# Patient Record
Sex: Female | Born: 1935 | Race: Black or African American | Hispanic: No | Marital: Married | Smoking: Current every day smoker
Health system: Southern US, Community
[De-identification: ages and names within clinical notes are randomized; demographics above are authoritative.]

## PROBLEM LIST (undated history)

## (undated) DIAGNOSIS — Z720 Tobacco use: Secondary | ICD-10-CM

## (undated) DIAGNOSIS — M1990 Unspecified osteoarthritis, unspecified site: Secondary | ICD-10-CM

## (undated) DIAGNOSIS — F209 Schizophrenia, unspecified: Secondary | ICD-10-CM

## (undated) DIAGNOSIS — I10 Essential (primary) hypertension: Secondary | ICD-10-CM

## (undated) HISTORY — DX: Unspecified osteoarthritis, unspecified site: M19.90

---

## 2018-03-24 ENCOUNTER — Inpatient Hospital Stay (HOSPITAL_COMMUNITY)
Admission: EM | Admit: 2018-03-24 | Discharge: 2018-03-27 | DRG: 812 | Disposition: A | Discharge status: Home / Self Care | Payer: Medicare Other | Attending: Internal Medicine | Admitting: Internal Medicine

## 2018-03-24 ENCOUNTER — Other Ambulatory Visit: Payer: Self-pay

## 2018-03-24 ENCOUNTER — Encounter (HOSPITAL_COMMUNITY): Payer: Self-pay | Admitting: Emergency Medicine

## 2018-03-24 DIAGNOSIS — Z79899 Other long term (current) drug therapy: Secondary | ICD-10-CM | POA: Diagnosis not present

## 2018-03-24 DIAGNOSIS — I10 Essential (primary) hypertension: Secondary | ICD-10-CM | POA: Diagnosis not present

## 2018-03-24 DIAGNOSIS — Z716 Tobacco abuse counseling: Secondary | ICD-10-CM | POA: Diagnosis not present

## 2018-03-24 DIAGNOSIS — Z720 Tobacco use: Secondary | ICD-10-CM | POA: Diagnosis present

## 2018-03-24 DIAGNOSIS — F17210 Nicotine dependence, cigarettes, uncomplicated: Secondary | ICD-10-CM | POA: Diagnosis present

## 2018-03-24 DIAGNOSIS — E876 Hypokalemia: Secondary | ICD-10-CM | POA: Diagnosis present

## 2018-03-24 DIAGNOSIS — D509 Iron deficiency anemia, unspecified: Secondary | ICD-10-CM | POA: Diagnosis present

## 2018-03-24 DIAGNOSIS — D649 Anemia, unspecified: Secondary | ICD-10-CM | POA: Diagnosis present

## 2018-03-24 DIAGNOSIS — F1010 Alcohol abuse, uncomplicated: Secondary | ICD-10-CM | POA: Diagnosis present

## 2018-03-24 DIAGNOSIS — F209 Schizophrenia, unspecified: Secondary | ICD-10-CM | POA: Diagnosis not present

## 2018-03-24 DIAGNOSIS — Z7982 Long term (current) use of aspirin: Secondary | ICD-10-CM | POA: Diagnosis not present

## 2018-03-24 HISTORY — DX: Essential (primary) hypertension: I10

## 2018-03-24 HISTORY — DX: Tobacco use: Z72.0

## 2018-03-24 HISTORY — DX: Schizophrenia, unspecified: F20.9

## 2018-03-24 LAB — COMPREHENSIVE METABOLIC PANEL
ALT: 17 U/L (ref 14–54)
AST: 24 U/L (ref 15–41)
Albumin: 3.1 g/dL — ABNORMAL LOW (ref 3.5–5.0)
Alkaline Phosphatase: 114 U/L (ref 38–126)
Anion gap: 13 (ref 5–15)
BUN: 9 mg/dL (ref 6–20)
CHLORIDE: 106 mmol/L (ref 101–111)
CO2: 22 mmol/L (ref 22–32)
Calcium: 8.6 mg/dL — ABNORMAL LOW (ref 8.9–10.3)
Creatinine, Ser: 0.85 mg/dL (ref 0.44–1.00)
GFR calc Af Amer: >60 mL/min (ref >60)
GFR calc non Af Amer: >60 mL/min (ref >60)
Glucose, Bld: 96 mg/dL (ref 65–99)
Potassium: 3 mmol/L — ABNORMAL LOW (ref 3.5–5.1)
SODIUM: 141 mmol/L (ref 135–145)
Total Bilirubin: 0.2 mg/dL — ABNORMAL LOW (ref 0.3–1.2)
Total Protein: 6.7 g/dL (ref 6.5–8.1)

## 2018-03-24 LAB — RETICULOCYTES
RBC.: 2.32 MIL/uL — AB (ref 3.87–5.11)
RETIC COUNT ABSOLUTE: 25.5 10*3/uL (ref 19.0–186.0)
Retic Ct Pct: 1.1 % (ref 0.4–3.1)

## 2018-03-24 LAB — CBC
HCT: 13.8 % — ABNORMAL LOW (ref 36.0–46.0)
HEMATOCRIT: 13.1 % — AB (ref 36.0–46.0)
HEMOGLOBIN: 3.3 g/dL — AB (ref 12.0–15.0)
Hemoglobin: 3.5 g/dL — CL (ref 12.0–15.0)
MCH: 14.5 pg — AB (ref 26.0–34.0)
MCH: 14.2 pg — AB (ref 26.0–34.0)
MCHC: 25.4 g/dL — ABNORMAL LOW (ref 30.0–36.0)
MCHC: 25.2 g/dL — AB (ref 30.0–36.0)
MCV: 57 fL — AB (ref 78.0–100.0)
MCV: 56.5 fL — ABNORMAL LOW (ref 78.0–100.0)
Platelets: 316 10*3/uL (ref 150–400)
Platelets: 307 10*3/uL (ref 150–400)
RBC: 2.42 MIL/uL — AB (ref 3.87–5.11)
RBC: 2.32 MIL/uL — ABNORMAL LOW (ref 3.87–5.11)
RDW: 22.8 % — ABNORMAL HIGH (ref 11.5–15.5)
RDW: 22.9 % — ABNORMAL HIGH (ref 11.5–15.5)
WBC: 6.8 10*3/uL (ref 4.0–10.5)
WBC: 6.3 10*3/uL (ref 4.0–10.5)

## 2018-03-24 LAB — SAVE SMEAR

## 2018-03-24 LAB — DIFFERENTIAL
BASOS PCT: 0 %
Basophils Absolute: 0 10*3/uL (ref 0.0–0.1)
EOS PCT: 0 %
Eosinophils Absolute: 0 10*3/uL (ref 0.0–0.7)
LYMPHS PCT: 29 %
Lymphs Abs: 1.8 10*3/uL (ref 0.7–4.0)
Monocytes Absolute: 0.6 10*3/uL (ref 0.1–1.0)
Monocytes Relative: 10 %
Neutro Abs: 3.9 10*3/uL (ref 1.7–7.7)
Neutrophils Relative %: 61 %

## 2018-03-24 LAB — PROTIME-INR
INR: 1.13
Prothrombin Time: 14.4 seconds (ref 11.4–15.2)

## 2018-03-24 LAB — IRON AND TIBC
Iron: 6 ug/dL — ABNORMAL LOW (ref 28–170)
Saturation Ratios: 1 % — ABNORMAL LOW (ref 10.4–31.8)
TIBC: 455 ug/dL — ABNORMAL HIGH (ref 250–450)
UIBC: 449 ug/dL

## 2018-03-24 LAB — MAGNESIUM: Magnesium: 2.3 mg/dL (ref 1.7–2.4)

## 2018-03-24 LAB — ABO/RH: ABO/RH(D): A POS

## 2018-03-24 LAB — LACTATE DEHYDROGENASE: LDH: 209 U/L — ABNORMAL HIGH (ref 98–192)

## 2018-03-24 LAB — FERRITIN: FERRITIN: 3 ng/mL — AB (ref 11–307)

## 2018-03-24 LAB — FOLATE: Folate: 9.2 ng/mL (ref >5.9)

## 2018-03-24 LAB — VITAMIN B12: VITAMIN B 12: 230 pg/mL (ref 180–914)

## 2018-03-24 LAB — POC OCCULT BLOOD, ED: FECAL OCCULT BLD: NEGATIVE

## 2018-03-24 LAB — PREPARE RBC (CROSSMATCH)

## 2018-03-24 LAB — APTT: aPTT: 28 seconds (ref 24–36)

## 2018-03-24 MED ORDER — SENNOSIDES-DOCUSATE SODIUM 8.6-50 MG PO TABS: 1.0000 | ORAL_TABLET | Freq: Every evening | ORAL | Status: DC | PRN

## 2018-03-24 MED ORDER — AMLODIPINE BESYLATE 10 MG PO TABS
10.0000 mg | ORAL_TABLET | Freq: Every day | ORAL | Status: DC
  Administered 2018-03-25 – 2018-03-27 (×3): 10 mg via ORAL
  Filled 2018-03-24 (×3): qty 1

## 2018-03-24 MED ORDER — POTASSIUM CHLORIDE CRYS ER 20 MEQ PO TBCR
40.0000 meq | EXTENDED_RELEASE_TABLET | Freq: Once | ORAL | Status: AC
  Administered 2018-03-24: 40 meq via ORAL
  Filled 2018-03-24: qty 2

## 2018-03-24 MED ORDER — ACETAMINOPHEN 650 MG RE SUPP: 650.0000 mg | Freq: Four times a day (QID) | RECTAL | Status: DC | PRN

## 2018-03-24 MED ORDER — NICOTINE 21 MG/24HR TD PT24
21.0000 mg | MEDICATED_PATCH | Freq: Every day | TRANSDERMAL | Status: DC
  Administered 2018-03-24 – 2018-03-27 (×4): 21 mg via TRANSDERMAL
  Filled 2018-03-24 (×4): qty 1

## 2018-03-24 MED ORDER — SODIUM CHLORIDE 0.9 % IV BOLUS
500.0000 mL | Freq: Once | INTRAVENOUS | Status: AC
  Administered 2018-03-24: 500 mL via INTRAVENOUS

## 2018-03-24 MED ORDER — HYDRALAZINE HCL 20 MG/ML IJ SOLN: 5.0000 mg | INTRAMUSCULAR | Status: DC | PRN

## 2018-03-24 MED ORDER — ONDANSETRON HCL 4 MG/2ML IJ SOLN: 4.0000 mg | Freq: Four times a day (QID) | INTRAMUSCULAR | Status: DC | PRN

## 2018-03-24 MED ORDER — TRAZODONE HCL 50 MG PO TABS
50.0000 mg | ORAL_TABLET | Freq: Every day | ORAL | Status: DC
  Administered 2018-03-24 – 2018-03-25 (×2): 50 mg via ORAL
  Filled 2018-03-24 (×2): qty 1

## 2018-03-24 MED ORDER — LORAZEPAM 2 MG/ML IJ SOLN
0.5000 mg | Freq: Three times a day (TID) | INTRAMUSCULAR | Status: DC | PRN
  Administered 2018-03-24 – 2018-03-25 (×2): 0.5 mg via INTRAVENOUS
  Filled 2018-03-24 (×2): qty 1

## 2018-03-24 MED ORDER — SODIUM CHLORIDE 0.9 % IV SOLN
Freq: Once | INTRAVENOUS | Status: AC
  Administered 2018-03-24: 20:00:00 via INTRAVENOUS

## 2018-03-24 MED ORDER — ONDANSETRON HCL 4 MG PO TABS: 4.0000 mg | ORAL_TABLET | Freq: Four times a day (QID) | ORAL | Status: DC | PRN

## 2018-03-24 MED ORDER — ZOLPIDEM TARTRATE 5 MG PO TABS: 5.0000 mg | ORAL_TABLET | Freq: Every evening | ORAL | Status: DC | PRN

## 2018-03-24 MED ORDER — ACETAMINOPHEN 325 MG PO TABS
650.0000 mg | ORAL_TABLET | Freq: Four times a day (QID) | ORAL | Status: DC | PRN
Start: 2018-03-24 — End: 2018-03-27
  Filled 2018-03-24: qty 2

## 2018-03-24 MED ORDER — SODIUM CHLORIDE 0.9 % IV BOLUS: 1000.0000 mL | Freq: Once | INTRAVENOUS | Status: DC

## 2018-03-24 MED ORDER — TRIHEXYPHENIDYL HCL 2 MG PO TABS
4.0000 mg | ORAL_TABLET | Freq: Every day | ORAL | Status: DC
  Administered 2018-03-25 – 2018-03-27 (×3): 4 mg via ORAL
  Filled 2018-03-24 (×3): qty 2

## 2018-03-24 NOTE — ED Provider Notes (Signed)
[facility] Provider Note   CSN: [id number] Arrival date & time: 03/24/18  1548     History   Chief Complaint Chief Complaint  Patient presents with  . Abnormal Lab    HPI Janice Castro is a 82 y.o. female.  Chief complaint is abnormal lab.  HPI: 82 year old female.  History of schizophrenia.  Does not see physician on a regular basis other than her psychiatry.  Is on Depo Haldol, and Artane.   Saw primary care physician had some outpatient labs obtained.  Was called today with a hemoglobin of 3.  Patient has schizophrenia and is difficult to get any details of history from.  Her daughter who lives with her and is her primary caregiver states that she has been short of breath with ambulation for "a month".  No dark stools.  No hematemesis.  Past Medical History:  Diagnosis Date  . Hypertension   . Schizophrenia (HCC)   . Tobacco abuse     Patient Active Problem List   Diagnosis Date Noted  . Symptomatic anemia 03/24/2018  . Hypokalemia 03/24/2018  . Schizophrenia (HCC)   . Hypertension   . Tobacco abuse     History reviewed. No pertinent surgical history.   OB History   None      Home Medications    Prior to Admission medications   Medication Sig Start Date End Date Taking? Authorizing Provider  amLODipine (NORVASC) 10 MG tablet Take 10 mg by mouth daily.   Yes [provider]  aspirin EC 325 MG tablet Take 650 mg by mouth 2 (two) times daily as needed (pain).   Yes [provider]  haloperidol decanoate (HALDOL DECANOATE) 100 MG/ML injection Inject 75 mg into the muscle every 28 (twenty-eight) days. Last injection 03/08/18   Yes [provider]  traZODone (DESYREL) 50 MG tablet Take 25-50 mg by mouth at bedtime as needed for sleep.   Yes [provider]  trihexyphenidyl (ARTANE) 2 MG tablet Take 4 mg by mouth daily.   Yes [provider]    Family History No family history on  file.  Social History Social History   Tobacco Use  . Smoking status: Current Every Day Smoker    Packs/day: 1.00    Types: Cigarettes  . Smokeless tobacco: Never Used  Substance Use Topics  . Alcohol use: Not Currently  . Drug use: Not Currently     Allergies   Patient has no known allergies.   Review of Systems Review of Systems  Constitutional: Negative for appetite change, chills, diaphoresis, fatigue and fever.  HENT: Negative for mouth sores, sore throat and trouble swallowing.   Eyes: Negative for visual disturbance.  Respiratory: Positive for shortness of breath. Negative for cough, chest tightness and wheezing.   Cardiovascular: Negative for chest pain.  Gastrointestinal: Negative for abdominal distention, abdominal pain, diarrhea, nausea and vomiting.  Endocrine: Negative for polydipsia, polyphagia and polyuria.  Genitourinary: Negative for dysuria, frequency and hematuria.  Musculoskeletal: Negative for gait problem.  Skin: Negative for color change, pallor and rash.  Neurological: Positive for weakness. Negative for dizziness, syncope, light-headedness and headaches.  Hematological: Does not bruise/bleed easily.  Psychiatric/Behavioral: Negative for behavioral problems and confusion.     Physical Exam Updated Vital Signs BP (!) 139/58   Pulse 75   Temp 98.4 F (36.9 C) (Oral)   Resp 18   Ht 5\' 4"  (1.626 m)   Wt 45.4 kg (100 lb)  SpO2 94%   BMI 17.16 kg/m   Physical Exam  Constitutional: She is oriented to person, place, and time. No distress.  Loud voice.  Increased hypermotor activity.  Not agitated.  HENT:  Head: Normocephalic.  Eyes: Pupils are equal, round, and reactive to light. Conjunctivae are normal. No scleral icterus.  Conjunctive are pale.  Neck: Normal range of motion. Neck supple. No thyromegaly present.  Cardiovascular: Normal rate and regular rhythm. Exam reveals no gallop and no friction rub.  No murmur heard. Pulmonary/Chest:  Effort normal and breath sounds normal. No respiratory distress. She has no wheezes. She has no rales.  Abdominal: Soft. Bowel sounds are normal. She exhibits no distension. There is no tenderness. There is no rebound.  Genitourinary:  Genitourinary Comments: Rectal exam brown stool.  Guaiac negative.  Musculoskeletal: Normal range of motion.  Neurological: She is alert and oriented to person, place, and time.  Skin: Skin is warm and dry. No rash noted.  Psychiatric: She has a normal mood and affect. Her behavior is normal.     ED Treatments / Results  Labs (all labs ordered are listed, but only abnormal results are displayed) Labs Reviewed  COMPREHENSIVE METABOLIC PANEL - Abnormal; Notable for the following components:      Result Value   Potassium 3.0 (*)    Calcium 8.6 (*)    Albumin 3.1 (*)    Total Bilirubin 0.2 (*)    All other components within normal limits  CBC - Abnormal; Notable for the following components:   RBC 2.42 (*)    Hemoglobin 3.5 (*)    HCT 13.8 (*)    MCV 57.0 (*)    MCH 14.5 (*)    MCHC 25.4 (*)    RDW 22.8 (*)    All other components within normal limits  LACTATE DEHYDROGENASE - Abnormal; Notable for the following components:   LDH 209 (*)    All other components within normal limits  MAGNESIUM  VITAMIN B12  FOLATE  IRON AND TIBC  FERRITIN  RETICULOCYTES  HAPTOGLOBIN  PATHOLOGIST SMEAR REVIEW  SAVE SMEAR  DIFFERENTIAL  CBC  CBC  CBC  PROTIME-INR  BASIC METABOLIC PANEL  APTT  POC OCCULT BLOOD, ED  TYPE AND SCREEN  ABO/RH  PREPARE RBC (CROSSMATCH)    EKG None  Radiology No results found.  Procedures Procedures (including critical care time)  Medications Ordered in ED Medications  potassium chloride SA (K-DUR,KLOR-CON) CR tablet 40 mEq (has no administration in time range)  sodium chloride 0.9 % bolus 500 mL (has no administration in time range)  acetaminophen (TYLENOL) tablet 650 mg (has no administration in time range)     Or  acetaminophen (TYLENOL) suppository 650 mg (has no administration in time range)  senna-docusate (Senokot-S) tablet 1 tablet (has no administration in time range)  ondansetron (ZOFRAN) tablet 4 mg (has no administration in time range)    Or  ondansetron (ZOFRAN) injection 4 mg (has no administration in time range)  hydrALAZINE (APRESOLINE) injection 5 mg (has no administration in time range)  zolpidem (AMBIEN) tablet 5 mg (has no administration in time range)  LORazepam (ATIVAN) injection 0.5 mg (has no administration in time range)  nicotine (NICODERM CQ - dosed in mg/24 hours) patch 21 mg (has no administration in time range)  traZODone (DESYREL) tablet 50 mg (has no administration in time range)  0.9 %  sodium chloride infusion ( Intravenous New Bag/Given 03/24/18 1936)     Initial Impression / Assessment and  Plan / ED Course  I have reviewed the triage vital signs and the nursing notes.  Pertinent labs & imaging results that were available during my care of the patient were reviewed by me and considered in my medical decision making (see chart for details).    Blood transfusion requested and initiated in the emergency room.  Discussed with hospitalist Dr. Clyde Lundborg.  Will be admitted for further testing regarding her anemia.  CRITICAL CARE Performed by: Claudean Kinds   Total critical care time: 30 minutes  Critical care time was exclusive of separately billable procedures and treating other patients.  Critical care was necessary to treat or prevent imminent or life-threatening deterioration.  Critical care was time spent personally by me on the following activities: development of treatment plan with patient and/or surrogate as well as nursing, discussions with consultants, evaluation of patient's response to treatment, examination of patient, obtaining history from patient or surrogate, ordering and performing treatments and interventions, ordering and review of laboratory  studies, ordering and review of radiographic studies, pulse oximetry and re-evaluation of patient's condition.   Final Clinical Impressions(s) / ED Diagnoses   Final diagnoses:  Schizophrenia, unspecified type [facility])  Essential hypertension  Tobacco abuse    ED Discharge Orders    None       Rolland Porter, MD 03/24/18 2028

## 2018-03-24 NOTE — ED Notes (Signed)
Attempted report at this time.  Nurse to call back when available. 

## 2018-03-24 NOTE — ED Triage Notes (Signed)
Pt sent by doctors office for reports of hgb of 3.

## 2018-03-24 NOTE — H&P (Addendum)
History and Physical    Janice Castro Obey RUE:[id number] DOB: 1936-05-10 DOA: 03/24/2018  Referring MD/NP/PA:   PCP: No primary care provider on file.   Patient coming from:  The patient is coming from home.  At baseline, pt is independent for most of ADL.   Chief Complaint: SOB and low hgb  HPI: Janice Castro Justin is a 82 y.o. female with medical history significant of Hypertension, tobacco abuse, schizophrenia, who presents with shortness of breath and low hemoglobin.  Patient has history of schizophrenia, cannot provide detailed medical history, therefore, most of the history is obtained by discussing the case with ED physician, per EMS report, and with the nursing staff. Per EDP, patient's sister reported that the patient has been having shortness breath for several weeks. She was seen by PCP and found to have Hgb of 3.0 and sent to ED for further evaluation and treatment. When I saw pt in ED, she has euphoria, and does not have any complains. She keep saying "I am okay". She denies chest pain, shortness breath, cough, nausea, vomiting, diarrhea, abdominal pain, symptoms of UTI. Denies hematuria, hematochezia, hematemesis. No symptoms of UTI or unilateral weakness. She looks pale.  ED Course: pt was found to have Hgb 3.5 (no baseline Hgb available), negative FOBT, WBC 6.8, potassium 3.0, creatinine normal,temperature 99.4, heart rate in 90s, blood pressure 119/44, oxygen saturation 100% on room air, no tachypnea. Patient is admitted to telemetry bed as inpatient.  Review of Systems:   General: no fevers, chills, no body weight gain, no fatigue HEENT: no blurry vision, hearing changes or sore throat Respiratory: has dyspnea, no coughing, wheezing CV: no chest pain, no palpitations GI: no nausea, vomiting, abdominal pain, diarrhea, constipation GU: no dysuria, burning on urination, increased urinary frequency, hematuria  Ext: no leg edema Neuro: no unilateral weakness, numbness, or tingling,  no vision change or hearing loss Skin: no rash, no skin tear. MSK: No muscle spasm, no deformity, no limitation of range of movement in spin Heme: No easy bruising.  Travel history: No recent long distant travel.  Allergy: No Known Allergies  Past Medical History:  Diagnosis Date  . Hypertension   . Schizophrenia (HCC)   . Tobacco abuse     History reviewed. No pertinent surgical history. I tried to review with patient about surgical history, not successful due to schizophrenia.  Social History:  reports that she has been smoking cigarettes.  She has been smoking about 1.00 pack per day. She has never used smokeless tobacco. She reports that she drank alcohol. She reports that she has current or past drug history.  Family History:  I tried to review with patient about family history, but not successful due to schizophrenia.   Prior to Admission medications   Not on File    Physical Exam: Vitals:   03/24/18 1930 03/24/18 1945 03/24/18 2000 03/24/18 2004  BP: (!) 143/66  (!) 139/58   Pulse: 98 90  75  Resp:      Temp:      TempSrc:      SpO2: 100% 95%  94%  Weight:      Height:       General: Not in acute distress. Pale looking, dry mucus and membrane HEENT:       Eyes: PERRL, EOMI, no scleral icterus.       ENT: No discharge from the ears and nose, no pharynx injection, no tonsillar enlargement.        Neck: No JVD,  no bruit, no mass felt. Heme: No neck lymph node enlargement. Cardiac: S1/S2, RRR, No murmurs, No gallops or rubs. Respiratory: No rales, wheezing, rhonchi or rubs. GI: Soft, nondistended, nontender, no rebound pain, no organomegaly, BS present. GU: No hematuria Ext: No pitting leg edema bilaterally. 2+DP/PT pulse bilaterally. Musculoskeletal: No joint deformities, No joint redness or warmth, no limitation of ROM in spin. Skin: No rashes.  Neuro: Alert, oriented X3, cranial nerves II-XII grossly intact, moves all extremities normally.  Psych: Patient is  euphoric, no suicidal or hemocidal ideation.  Labs on Admission: I have personally reviewed following labs and imaging studies  CBC: Recent Labs  Lab 03/24/18 1656  WBC 6.8  HGB 3.5*  HCT 13.8*  MCV 57.0*  PLT 316   Basic Metabolic Panel: Recent Labs  Lab 03/24/18 1656 03/24/18 2000  NA 141  --   K 3.0*  --   CL 106  --   CO2 22  --   GLUCOSE 96  --   BUN 9  --   CREATININE 0.85  --   CALCIUM 8.6*  --   MG  --  2.3   GFR: Estimated Creatinine Clearance: 38.5 mL/min (by C-G formula based on SCr of 0.85 mg/dL). Liver Function Tests: Recent Labs  Lab 03/24/18 1656  AST 24  ALT 17  ALKPHOS 114  BILITOT 0.2*  PROT 6.7  ALBUMIN 3.1*   No results for input(s): LIPASE, AMYLASE in the last 168 hours. No results for input(s): AMMONIA in the last 168 hours. Coagulation Profile: No results for input(s): INR, PROTIME in the last 168 hours. Cardiac Enzymes: No results for input(s): CKTOTAL, CKMB, CKMBINDEX, TROPONINI in the last 168 hours. BNP (last 3 results) No results for input(s): PROBNP in the last 8760 hours. HbA1C: No results for input(s): HGBA1C in the last 72 hours. CBG: No results for input(s): GLUCAP in the last 168 hours. Lipid Profile: No results for input(s): CHOL, HDL, LDLCALC, TRIG, CHOLHDL, LDLDIRECT in the last 72 hours. Thyroid Function Tests: No results for input(s): TSH, T4TOTAL, FREET4, T3FREE, THYROIDAB in the last 72 hours. Anemia Panel: Recent Labs    03/24/18 1931  RETICCTPCT 1.1   Urine analysis: No results found for: COLORURINE, APPEARANCEUR, LABSPEC, PHURINE, GLUCOSEU, HGBUR, BILIRUBINUR, KETONESUR, PROTEINUR, UROBILINOGEN, NITRITE, LEUKOCYTESUR Sepsis Labs: @LABRCNTIP (procalcitonin:4,lacticidven:4) )No results found for this or any previous visit (from the past 240 hour(s)).   Radiological Exams on Admission: No results found.   EKG:  Not done in ED, will get one.   Assessment/Plan Principal Problem:   Symptomatic  anemia Active Problems:   Schizophrenia (HCC)   Hypertension   Hypokalemia   Tobacco abuse   Symptomatic anemia: Hgb 3.5. FOBT negative. No EGD or colonoscopy date on record. Unclear etiology for anemia. Currently hemodynamically stable. MVC 57, likely has iron deficiency, will follow-up anemia panel. Pt will need colonoscopy as outpatient if she did not have one.  -will admit to tele bed as inpt. -transfuse 2 U of blood -2 large IV  -cbc q6h -IVF: 500 NS bolus -check LDH, peripheral smear and haptoglobin, anemia panel, differential  Anemia panel showed iron deficiency -will give one dose of IV Feraheam  -start ferrous sulfate --When necessary Senokot   Schizophrenia (HCC): has euphoria, but no agitation now. -pt has is getting haloperidol injection every 28 days, last dose was on 03/08/18 -Continue Artane -prn ativan for agitation  HTN:  -Continue home medications: amlodipine (we'll hold tonight due to severe anemia and restart tomorrow morning) -  IV hydralazine prn  Hypokalemia: K=3.0 on admission. - Repleted - Check Mg level  Tobacco abuse and Alcohol abuse: -try to do counseling about importance of quitting smoking, but pt dose not want to listen -Nicotine patch   DVT ppx: SCD Code Status: Full code Family Communication: None at bed side.      Disposition Plan:  Anticipate discharge back to previous home environment Consults called:  none Admission status:   Inpatient/tele         Date of Service 03/24/2018    Lorretta Harp [facility] Pager [phone]  If 7PM-7AM, please contact night-coverage [email or web address] Password [facility] 03/24/2018, 8:44 PM

## 2018-03-25 LAB — BASIC METABOLIC PANEL
Anion gap: 9 (ref 5–15)
BUN: 8 mg/dL (ref 6–20)
CHLORIDE: 109 mmol/L (ref 101–111)
CO2: 22 mmol/L (ref 22–32)
CREATININE: 0.73 mg/dL (ref 0.44–1.00)
Calcium: 8.4 mg/dL — ABNORMAL LOW (ref 8.9–10.3)
GFR calc non Af Amer: >60 mL/min (ref >60)
Glucose, Bld: 86 mg/dL (ref 65–99)
POTASSIUM: 3.6 mmol/L (ref 3.5–5.1)
Sodium: 140 mmol/L (ref 135–145)

## 2018-03-25 LAB — CBC
HEMATOCRIT: 35.6 % — AB (ref 36.0–46.0)
HEMATOCRIT: 35.4 % — AB (ref 36.0–46.0)
HEMOGLOBIN: 11.2 g/dL — AB (ref 12.0–15.0)
HEMOGLOBIN: 11.9 g/dL — AB (ref 12.0–15.0)
MCH: 22.4 pg — AB (ref 26.0–34.0)
MCH: 23.6 pg — ABNORMAL LOW (ref 26.0–34.0)
MCHC: 31.5 g/dL (ref 30.0–36.0)
MCHC: 33.6 g/dL (ref 30.0–36.0)
MCV: 71.3 fL — ABNORMAL LOW (ref 78.0–100.0)
MCV: 70.1 fL — AB (ref 78.0–100.0)
Platelets: 237 10*3/uL (ref 150–400)
Platelets: ADEQUATE 10*3/uL (ref 150–400)
RBC: 4.99 MIL/uL (ref 3.87–5.11)
RBC: 5.05 MIL/uL (ref 3.87–5.11)
RDW: 25.5 % — ABNORMAL HIGH (ref 11.5–15.5)
RDW: 26.2 % — AB (ref 11.5–15.5)
WBC: 6.2 10*3/uL (ref 4.0–10.5)
WBC: 6.5 10*3/uL (ref 4.0–10.5)

## 2018-03-25 LAB — GLUCOSE, CAPILLARY: GLUCOSE-CAPILLARY: 84 mg/dL (ref 65–99)

## 2018-03-25 MED ORDER — SODIUM CHLORIDE 0.9 % IV SOLN
510.0000 mg | Freq: Once | INTRAVENOUS | Status: AC
  Administered 2018-03-25: 510 mg via INTRAVENOUS
  Filled 2018-03-25: qty 17

## 2018-03-25 MED ORDER — SENNOSIDES-DOCUSATE SODIUM 8.6-50 MG PO TABS
1.0000 | ORAL_TABLET | Freq: Two times a day (BID) | ORAL | Status: DC | PRN
  Administered 2018-03-26: 1 via ORAL
  Filled 2018-03-25 (×2): qty 1

## 2018-03-25 MED ORDER — FERROUS SULFATE 325 (65 FE) MG PO TABS
325.0000 mg | ORAL_TABLET | Freq: Two times a day (BID) | ORAL | Status: DC
  Administered 2018-03-25 – 2018-03-27 (×5): 325 mg via ORAL
  Filled 2018-03-25 (×5): qty 1

## 2018-03-25 NOTE — Progress Notes (Signed)
PROGRESS NOTE    Janice Castro  ZOX:[id number] DOB: 07-07-1936 DOA: 03/24/2018 PCP: No primary care provider on file.   Brief Narrative:81 y.o. female with medical history significant of Hypertension, tobacco abuse, schizophrenia, who presents with shortness of breath and low hemoglobin.  Patient has history of schizophrenia, cannot provide detailed medical history, therefore, most of the history is obtained by discussing the case with ED physician, per EMS report, and with the nursing staff. Per EDP, patient's sister reported that the patient has been having shortness breath for several weeks. She was seen by PCP and found to have Hgb of 3.0 and sent to ED for further evaluation and treatment. When I saw pt in ED, she has euphoria, and does not have any complains. She keep saying "I am okay". She denies chest pain, shortness breath, cough, nausea, vomiting, diarrhea, abdominal pain, symptoms of UTI. Denies hematuria, hematochezia, hematemesis. No symptoms of UTI or unilateral weakness. She looks pale.  ED Course: pt was found to have Hgb 3.5 (no baseline Hgb available), negative FOBT, WBC 6.8, potassium 3.0, creatinine normal,temperature 99.4, heart rate in 90s, blood pressure 119/44, oxygen saturation 100% on room air, no tachypnea. Patient is admitted to telemetry bed as inpatient.    Assessment & Plan:   Principal Problem:   Symptomatic anemia Active Problems:   Schizophrenia (HCC)   Hypertension   Hypokalemia   Tobacco abuse  1] severe symptomatic iron deficiency anemia-hemoglobin of 3.5.  FOBT negative.  Patient's daughter was at the bedside today.  She reported that she has never been told she is anemic.  Has never had a GI workup.  Patient lives alone at home.  Daughter checks up on her every day.  She has received 1 unit of blood transfusion so for the second 1 is ongoing at this time IV Feraheme ordered.  Repeat hemoglobin is pending.  Patient is awake alert asking for cool  aid.  2] hypertension restart Norvasc.  3]Schizophrenia stable    DVT prophylaxis SCD Code Status: Full code Family Communication: Discussed with daughter who was in the room Disposition Plan: TBD if she remains stable plan discharge tomorrow and she can follow-up with GI as an outpatient.  Consultants: None  Procedures: None Antimicrobials: None  Subjective: Resting in bed in no acute distress asking for something to eat or drink.  Objective: Vitals:   03/25/18 0522 03/25/18 0607 03/25/18 0622 03/25/18 0924  BP: (!) 143/68 138/70 136/67 (!) 147/67  Pulse: 76 79 79 74  Resp: 16 18 18 17   Temp: 98.7 F (37.1 C) 98.7 F (37.1 C) 99.2 F (37.3 C) 99.4 F (37.4 C)  TempSrc: Oral Oral Oral Oral  SpO2: 98%   97%  Weight:      Height:        Intake/Output Summary (Last 24 hours) at 03/25/2018 1144 Last data filed at 03/25/2018 0924 Gross per 24 hour  Intake 1708 ml  Output -  Net 1708 ml   Filed Weights   03/24/18 1601 03/24/18 2106  Weight: 45.4 kg (100 lb) 47.4 kg (104 lb 8 oz)    Examination:  General exam: Appears calm and comfortable  Respiratory system: Clear to auscultation. Respiratory effort normal. Cardiovascular system: S1 & S2 heard, RRR. No JVD, murmurs, rubs, gallops or clicks. No pedal edema. Gastrointestinal system: Abdomen is nondistended, soft and nontender. No organomegaly or masses felt. Normal bowel sounds heard. Central nervous system: Alert and oriented. No focal neurological deficits. Extremities: Symmetric 5 x 5  power. Skin: No rashes, lesions or ulcers Psychiatry: Judgement and insight appear normal. Mood & affect appropriate.     Data Reviewed: I have personally reviewed following labs and imaging studies  CBC: Recent Labs  Lab 03/24/18 1656 03/24/18 1913  WBC 6.8 6.3  NEUTROABS  --  3.9  HGB 3.5* 3.3*  HCT 13.8* 13.1*  MCV 57.0* 56.5*  PLT 316 307   Basic Metabolic Panel: Recent Labs  Lab 03/24/18 1656 03/24/18 2000    NA 141  --   K 3.0*  --   CL 106  --   CO2 22  --   GLUCOSE 96  --   BUN 9  --   CREATININE 0.85  --   CALCIUM 8.6*  --   MG  --  2.3   GFR: Estimated Creatinine Clearance: 40.2 mL/min (by C-G formula based on SCr of 0.85 mg/dL). Liver Function Tests: Recent Labs  Lab 03/24/18 1656  AST 24  ALT 17  ALKPHOS 114  BILITOT 0.2*  PROT 6.7  ALBUMIN 3.1*   No results for input(s): LIPASE, AMYLASE in the last 168 hours. No results for input(s): AMMONIA in the last 168 hours. Coagulation Profile: Recent Labs  Lab 03/24/18 1929  INR 1.13   Cardiac Enzymes: No results for input(s): CKTOTAL, CKMB, CKMBINDEX, TROPONINI in the last 168 hours. BNP (last 3 results) No results for input(s): PROBNP in the last 8760 hours. HbA1C: No results for input(s): HGBA1C in the last 72 hours. CBG: Recent Labs  Lab 03/25/18 0759  GLUCAP 84   Lipid Profile: No results for input(s): CHOL, HDL, LDLCALC, TRIG, CHOLHDL, LDLDIRECT in the last 72 hours. Thyroid Function Tests: No results for input(s): TSH, T4TOTAL, FREET4, T3FREE, THYROIDAB in the last 72 hours. Anemia Panel: Recent Labs    03/24/18 1931  VITAMINB12 230  FOLATE 9.2  FERRITIN 3*  TIBC 455*  IRON 6*  RETICCTPCT 1.1   Sepsis Labs: No results for input(s): PROCALCITON, LATICACIDVEN in the last 168 hours.  No results found for this or any previous visit (from the past 240 hour(s)).       Radiology Studies: No results found.      Scheduled Meds: . amLODipine  10 mg Oral Daily  . ferrous sulfate  325 mg Oral BID WC  . nicotine  21 mg Transdermal Daily  . traZODone  50 mg Oral QHS  . trihexyphenidyl  4 mg Oral Daily   Continuous Infusions:   LOS: 1 day      Janice RenElizabeth G Lamya Lausch, MD [facility]  If 7PM-7AM, please contact night-coverage [email or web address] Password [email or web address] 03/25/2018, 11:44 AM

## 2018-03-26 LAB — URINALYSIS, ROUTINE W REFLEX MICROSCOPIC
Bilirubin Urine: NEGATIVE
Glucose, UA: NEGATIVE mg/dL
HGB URINE DIPSTICK: NEGATIVE
Ketones, ur: NEGATIVE mg/dL
Leukocytes, UA: NEGATIVE
Nitrite: NEGATIVE
PH: 7 (ref 5.0–8.0)
Protein, ur: NEGATIVE mg/dL
SPECIFIC GRAVITY, URINE: 1.011 (ref 1.005–1.030)

## 2018-03-26 LAB — CBC WITH DIFFERENTIAL/PLATELET
BASOS PCT: 0 %
Basophils Absolute: 0 10*3/uL (ref 0.0–0.1)
EOS ABS: 0.1 10*3/uL (ref 0.0–0.7)
Eosinophils Relative: 2 %
HCT: 35.4 % — ABNORMAL LOW (ref 36.0–46.0)
HEMOGLOBIN: 11.1 g/dL — AB (ref 12.0–15.0)
LYMPHS ABS: 1.5 10*3/uL (ref 0.7–4.0)
LYMPHS PCT: 24 %
MCH: 22.3 pg — AB (ref 26.0–34.0)
MCHC: 31.4 g/dL (ref 30.0–36.0)
MCV: 71.2 fL — ABNORMAL LOW (ref 78.0–100.0)
MONO ABS: 0.6 10*3/uL (ref 0.1–1.0)
Monocytes Relative: 9 %
NEUTROS ABS: 4 10*3/uL (ref 1.7–7.7)
Neutrophils Relative %: 65 %
Platelets: 236 10*3/uL (ref 150–400)
RBC: 4.97 MIL/uL (ref 3.87–5.11)
RDW: 25.8 % — ABNORMAL HIGH (ref 11.5–15.5)
WBC: 6.2 10*3/uL (ref 4.0–10.5)

## 2018-03-26 LAB — BASIC METABOLIC PANEL
Anion gap: 8 (ref 5–15)
BUN: <5 mg/dL — ABNORMAL LOW (ref 6–20)
CALCIUM: 8.2 mg/dL — AB (ref 8.9–10.3)
CHLORIDE: 109 mmol/L (ref 101–111)
CO2: 21 mmol/L — ABNORMAL LOW (ref 22–32)
Creatinine, Ser: 0.54 mg/dL (ref 0.44–1.00)
GFR calc Af Amer: >60 mL/min (ref >60)
GFR calc non Af Amer: >60 mL/min (ref >60)
Glucose, Bld: 81 mg/dL (ref 65–99)
Potassium: 2.9 mmol/L — ABNORMAL LOW (ref 3.5–5.1)
SODIUM: 138 mmol/L (ref 135–145)

## 2018-03-26 LAB — GLUCOSE, CAPILLARY: GLUCOSE-CAPILLARY: 99 mg/dL (ref 65–99)

## 2018-03-26 LAB — HAPTOGLOBIN: Haptoglobin: 140 mg/dL (ref 34–200)

## 2018-03-26 MED ORDER — STROKE: EARLY STAGES OF RECOVERY BOOK
Freq: Once | Status: DC
  Filled 2018-03-26: qty 1

## 2018-03-26 MED ORDER — POTASSIUM CHLORIDE CRYS ER 20 MEQ PO TBCR
40.0000 meq | EXTENDED_RELEASE_TABLET | ORAL | Status: AC
  Administered 2018-03-26 (×2): 40 meq via ORAL
  Filled 2018-03-26 (×2): qty 2

## 2018-03-26 MED ORDER — POTASSIUM CHLORIDE 10 MEQ/100ML IV SOLN
10.0000 meq | INTRAVENOUS | Status: AC
  Administered 2018-03-26 (×4): 10 meq via INTRAVENOUS
  Filled 2018-03-26 (×4): qty 100

## 2018-03-26 NOTE — Progress Notes (Signed)
PROGRESS NOTE    Janice Castro  ZOX:[id number] DOB: [birth year] DOA: 03/24/2018 PCP: No primary care provider on file.   Brief Narrative:81 y.o.femalewith medical history significant ofHypertension, tobacco abuse, schizophrenia, who presents with shortness of breath and low hemoglobin.  Patient hashistory of schizophrenia, cannot provide detailed medical history,therefore, most of the history is obtained by discussing the case with ED physician, per EMS report, and with the nursing staff. Per EDP, patient's sister reported that the patient has been having shortness breath for several weeks. She was seen by PCP and found to have Hgb of 3.0 and sent to ED for further evaluation and treatment. When I saw pt in ED, she has euphoria, and does not have any complains. She keep saying "I am okay". She denies chest pain, shortness breath, cough, nausea, vomiting, diarrhea, abdominal pain, symptoms of UTI. Denies hematuria, hematochezia, hematemesis. No symptoms of UTI or unilateral weakness. She looks pale.  ED Course:pt was found to haveHgb 3.5 (no baseline Hgb available), negative FOBT, WBC 6.8, potassium 3.0, creatinine normal,temperature 99.4, heart rate in 90s, blood pressure 119/44, oxygen saturation 100% on room air, no tachypnea. Patient is admitted to telemetry bed as inpatient.     Assessment & Plan:   Principal Problem:   Symptomatic anemia Active Problems:   Schizophrenia (HCC)   Hypertension   Hypokalemia   Tobacco abuse 1] severe symptomatic iron deficiency anemia-hemoglobin of 3.5 on admit. FOBT negative.  Patient's daughter was at the bedside today.  She reported that she has never been told she is anemic.  Has never had a GI workup.  Patient lives alone at home.  Daughter checks up on her every day.  Status post 2 units of blood transfusion and Feraheme.  Her last hemoglobin after blood transfusion was 11.1.    2] hypertension restart Norvasc.  3]Schizophrenia  stable  4] hypokalemia potassium 2.9 replete and recheck.     DVT prophylaxis: SCD Code Status: Full code Family Communication: Discussed with daughter Disposition Plan: We will obtain PT evaluation preparation for discharge tomorrow.   Consultants: None Procedures: None Antimicrobials: None  Subjective: Patient's daughter by the bedside reports that patient.  Bed twice which she has never done before.  Patient's daughter also requesting a Child psychotherapistsocial worker to see her for her to establish POA.  Objective: Vitals:   03/25/18 1327 03/25/18 2029 03/26/18 0956 03/26/18 1258  BP: 122/82 (!) 154/59 (!) 150/66 (!) 129/56  Pulse: (!) 18 72  72  Resp: 18 17  16   Temp: 98.9 F (37.2 C) 98.5 F (36.9 C)  98.8 F (37.1 C)  TempSrc: Oral Oral  Oral  SpO2: 98% 96%  98%  Weight:      Height:       No intake or output data in the 24 hours ending 03/26/18 1304 Filed Weights   03/24/18 1601 03/24/18 2106  Weight: 45.4 kg (100 lb) 47.4 kg (104 lb 8 oz)    Examination:  General exam: Appears calm and comfortable  Respiratory system: Clear to auscultation. Respiratory effort normal. Cardiovascular system: S1 & S2 heard, RRR. No JVD, murmurs, rubs, gallops or clicks. No pedal edema. Gastrointestinal system: Abdomen is nondistended, soft and nontender. No organomegaly or masses felt. Normal bowel sounds heard. Central nervous system: Alert and oriented. No focal neurological deficits. Extremities: Symmetric 5 x 5 power. Skin: No rashes, lesions or ulcers Psychiatry: Judgement and insight appear normal. Mood & affect appropriate.     Data Reviewed: I have personally reviewed  following labs and imaging studies  CBC: Recent Labs  Lab 03/24/18 1656 03/24/18 1913 03/25/18 1108 03/25/18 1832 03/26/18 0839  WBC 6.8 6.3 6.2 6.5 6.2  NEUTROABS  --  3.9  --   --  4.0  HGB 3.5* 3.3* 11.2* 11.9* 11.1*  HCT 13.8* 13.1* 35.6* 35.4* 35.4*  MCV 57.0* 56.5* 71.3* 70.1* 71.2*  PLT 316 307 237  PLATELET CLUMPS NOTED ON SMEAR, COUNT APPEARS ADEQUATE 236   Basic Metabolic Panel: Recent Labs  Lab 03/24/18 1656 03/24/18 2000 03/25/18 1108 03/26/18 0839  NA 141  --  140 138  K 3.0*  --  3.6 2.9*  CL 106  --  109 109  CO2 22  --  22 21*  GLUCOSE 96  --  86 81  BUN 9  --  8 <5*  CREATININE 0.85  --  0.73 0.54  CALCIUM 8.6*  --  8.4* 8.2*  MG  --  2.3  --   --    GFR: Estimated Creatinine Clearance: 42.7 mL/min (by C-G formula based on SCr of 0.54 mg/dL). Liver Function Tests: Recent Labs  Lab 03/24/18 1656  AST 24  ALT 17  ALKPHOS 114  BILITOT 0.2*  PROT 6.7  ALBUMIN 3.1*   No results for input(s): LIPASE, AMYLASE in the last 168 hours. No results for input(s): AMMONIA in the last 168 hours. Coagulation Profile: Recent Labs  Lab 03/24/18 1929  INR 1.13   Cardiac Enzymes: No results for input(s): CKTOTAL, CKMB, CKMBINDEX, TROPONINI in the last 168 hours. BNP (last 3 results) No results for input(s): PROBNP in the last 8760 hours. HbA1C: No results for input(s): HGBA1C in the last 72 hours. CBG: Recent Labs  Lab 03/25/18 0759 03/26/18 1256  GLUCAP 84 99   Lipid Profile: No results for input(s): CHOL, HDL, LDLCALC, TRIG, CHOLHDL, LDLDIRECT in the last 72 hours. Thyroid Function Tests: No results for input(s): TSH, T4TOTAL, FREET4, T3FREE, THYROIDAB in the last 72 hours. Anemia Panel: Recent Labs    03/24/18 1931  VITAMINB12 230  FOLATE 9.2  FERRITIN 3*  TIBC 455*  IRON 6*  RETICCTPCT 1.1   Sepsis Labs: No results for input(s): PROCALCITON, LATICACIDVEN in the last 168 hours.  No results found for this or any previous visit (from the past 240 hour(s)).       Radiology Studies: No results found.      Scheduled Meds: . amLODipine  10 mg Oral Daily  . ferrous sulfate  325 mg Oral BID WC  . nicotine  21 mg Transdermal Daily  . traZODone  50 mg Oral QHS  . trihexyphenidyl  4 mg Oral Daily   Continuous Infusions:   LOS: 2 days       Alwyn Ren, MD [facility] If 7PM-7AM, please contact night-coverage [email or web address] Password [email or web address] 03/26/2018, 1:04 PM

## 2018-03-26 NOTE — Evaluation (Signed)
Physical Therapy Evaluation Patient Details Name: Janice Castro MRN: [id number] DOB: September 17, 1936 Today's Date: 03/26/2018   History of Present Illness  Pt adm with symptomatic anemia with Hgb of 3.0. Transfused 2 units of blood. PMH - schizophrenia, HTN  Clinical Impression  Pt presents to PT close to baseline with mobility. Feel pt can return home with daughter provided intermittent support.    Follow Up Recommendations No PT follow up    Equipment Recommendations  None recommended by PT    Recommendations for Other Services       Precautions / Restrictions        Mobility  Bed Mobility Overal bed mobility: Modified Independent                Transfers Overall transfer level: Modified independent                  Ambulation/Gait Ambulation/Gait assistance: Supervision Ambulation Distance (Feet): 170 Feet Assistive device: None Gait Pattern/deviations: Step-through pattern;Drifts right/left   Gait velocity interpretation: 1.31 - 2.62 ft/sec, indicative of limited community ambulator General Gait Details: Slightly unsteady gait but pt able to correct without assist. Instability usually when distracted by conversation or other activities around her in hallway.  Stairs            Wheelchair Mobility    Modified Rankin (Stroke Patients Only)       Balance Overall balance assessment: Needs assistance Sitting-balance support: No upper extremity supported;Feet supported Sitting balance-Leahy Scale: Normal     Standing balance support: No upper extremity supported;During functional activity Standing balance-Leahy Scale: Good                               Pertinent Vitals/Pain Pain Assessment: Faces Faces Pain Scale: No hurt    Home Living Family/patient expects to be discharged to:: Private residence Living Arrangements: Alone Available Help at Discharge: Family;Available PRN/intermittently(daughter involved and checks on )            Home Equipment: None      Prior Function Level of Independence: Independent               Hand Dominance        Extremity/Trunk Assessment   Upper Extremity Assessment Upper Extremity Assessment: Overall WFL for tasks assessed    Lower Extremity Assessment Lower Extremity Assessment: Overall WFL for tasks assessed       Communication   Communication: No difficulties  Cognition Arousal/Alertness: Awake/alert Behavior During Therapy: WFL for tasks assessed/performed Overall Cognitive Status: History of cognitive impairments - at baseline                                        General Comments      Exercises     Assessment/Plan    PT Assessment Patent does not need any further PT services  PT Problem List         PT Treatment Interventions      PT Goals (Current goals can be found in the Care Plan section)  Acute Rehab PT Goals PT Goal Formulation: All assessment and education complete, DC therapy    Frequency     Barriers to discharge        Co-evaluation               AM-PAC PT "6 Clicks"  Daily Activity  Outcome Measure Difficulty turning over in bed (including adjusting bedclothes, sheets and blankets)?: None Difficulty moving from lying on back to sitting on the side of the bed? : None Difficulty sitting down on and standing up from a chair with arms (e.g., wheelchair, bedside commode, etc,.)?: None Help needed moving to and from a bed to chair (including a wheelchair)?: None Help needed walking in hospital room?: A Little Help needed climbing 3-5 steps with a railing? : A Little 6 Click Score: 22    End of Session   Activity Tolerance: Patient tolerated treatment well Patient left: in bed;with call bell/phone within reach;with family/visitor present   PT Visit Diagnosis: Unsteadiness on feet (R26.81)    Time: [id number] PT Time Calculation (min) (ACUTE ONLY): 8 min   Charges:   PT Evaluation $PT  Eval Low Complexity: 1 Low     PT G CodesSkip Mayer:        Sherisse Fullilove PT [phone]   Angelina OkCary W [facility]ok 03/26/2018, 4:38 PM

## 2018-03-27 DIAGNOSIS — D649 Anemia, unspecified: Secondary | ICD-10-CM

## 2018-03-27 LAB — BPAM RBC
BLOOD PRODUCT EXPIRATION DATE: 201905182359
BLOOD PRODUCT EXPIRATION DATE: 201905182359
BLOOD PRODUCT EXPIRATION DATE: 201905192359
Blood Product Expiration Date: 201905182359
Blood Product Expiration Date: 201905182359
Blood Product Expiration Date: 201905192359
ISSUE DATE / TIME: 201904191859
ISSUE DATE / TIME: 201904200224
ISSUE DATE / TIME: 201904192243
ISSUE DATE / TIME: 201904200558
UNIT TYPE AND RH: 6200
UNIT TYPE AND RH: 6200
UNIT TYPE AND RH: 6200
Unit Type and Rh: 6200
Unit Type and Rh: 6200
Unit Type and Rh: 6200

## 2018-03-27 LAB — BASIC METABOLIC PANEL
ANION GAP: 7 (ref 5–15)
BUN: <5 mg/dL — ABNORMAL LOW (ref 6–20)
CHLORIDE: 108 mmol/L (ref 101–111)
CO2: 20 mmol/L — AB (ref 22–32)
Calcium: 8.8 mg/dL — ABNORMAL LOW (ref 8.9–10.3)
Creatinine, Ser: 0.57 mg/dL (ref 0.44–1.00)
GFR calc Af Amer: >60 mL/min (ref >60)
GLUCOSE: 83 mg/dL (ref 65–99)
Potassium: 4.2 mmol/L (ref 3.5–5.1)
Sodium: 135 mmol/L (ref 135–145)

## 2018-03-27 LAB — TYPE AND SCREEN
ABO/RH(D): A POS
Antibody Screen: NEGATIVE
UNIT DIVISION: 0
UNIT DIVISION: 0
UNIT DIVISION: 0
Unit division: 0
Unit division: 0
Unit division: 0

## 2018-03-27 LAB — PATHOLOGIST SMEAR REVIEW

## 2018-03-27 LAB — GLUCOSE, CAPILLARY: Glucose-Capillary: 89 mg/dL (ref 65–99)

## 2018-03-27 MED ORDER — FERROUS SULFATE 325 (65 FE) MG PO TABS: 325.0000 mg | ORAL_TABLET | Freq: Two times a day (BID) | ORAL | 3 refills | Status: DC

## 2018-03-27 MED ORDER — NICOTINE 21 MG/24HR TD PT24: 21.0000 mg | MEDICATED_PATCH | Freq: Every day | TRANSDERMAL | 0 refills | Status: DC

## 2018-03-27 NOTE — Discharge Summary (Signed)
Physician Discharge Summary  Janice Castro ZOX:[id number] DOB: May 27, 1936 DOA: 03/24/2018  PCP: No primary care provider on file.  Admit date: 03/24/2018 Discharge date: 03/27/2018  Admitted From: Home Disposition:  Home Recommendations for Outpatient Follow-up:  1. Follow up with PCP in 1-2 weeks 2. Please obtain BMP/CBC in one week  Home Health: None Equipment/Devices none Discharge Condition: Stable CODE STATUS full code Diet recommendation:regular diet  Brief/Interim Summary::81 y.o.femalewith medical history significant ofHypertension, tobacco abuse, schizophrenia, who presents with shortness of breath and low hemoglobin.  Patient hashistory of schizophrenia, cannot provide detailed medical history,therefore, most of the history is obtained by discussing the case with ED physician, per EMS report, and with the nursing staff. Per EDP, patient's sister reported that the patient has been having shortness breath for several weeks. She was seen by PCP and found to have Hgb of 3.0 and sent to ED for further evaluation and treatment. When I saw pt in ED, she has euphoria, and does not have any complains. She keep saying "I am okay". She denies chest pain, shortness breath, cough, nausea, vomiting, diarrhea, abdominal pain, symptoms of UTI. Denies hematuria, hematochezia, hematemesis. No symptoms of UTI or unilateral weakness. She looks pale.  ED Course:pt was found to haveHgb 3.5 (no baseline Hgb available), negative FOBT, WBC 6.8, potassium 3.0, creatinine normal,temperature 99.4, heart rate in 90s, blood pressure 119/44, oxygen saturation 100% on room air, no tachypnea. Patient is admitted to telemetry bed as inpatient.     Discharge Diagnoses:  Principal Problem:   Symptomatic anemia Active Problems:   Schizophrenia (HCC)   Hypertension   Hypokalemia   Tobacco abuse  1] severe symptomatic iron deficiency anemia of unclear etiology.  She was given 2 units of blood  transfusion which brought her hemoglobin from 3.5-11.  FOBT was negative.  It was noted that she takes aspirin 650 mg twice a day at home.  This has been discontinued at the time of discharge.  Discussed with patient's daughter that patient must follow-up with GI even though her FOBT was negative.  She was also given Feraheme.  2] schizophrenia-continue home medications artane, Haldol, and trazodone.  3] hypertension continue Norvasc. Discharge Instructions  Discharge Instructions    Call MD for:  extreme fatigue   Complete by:  As directed    Call MD for:  persistant dizziness or light-headedness   Complete by:  As directed    Call MD for:  persistant nausea and vomiting   Complete by:  As directed    Call MD for:  redness, tenderness, or signs of infection (pain, swelling, redness, odor or green/yellow discharge around incision site)   Complete by:  As directed    Call MD for:  severe uncontrolled pain   Complete by:  As directed    Diet - low sodium heart healthy   Complete by:  As directed    Increase activity slowly   Complete by:  As directed      Allergies as of 03/27/2018   No Known Allergies     Medication List    STOP taking these medications   aspirin EC 325 MG tablet     TAKE these medications   amLODipine 10 MG tablet Commonly known as:  NORVASC Take 10 mg by mouth daily.   ferrous sulfate 325 (65 FE) MG tablet Take 1 tablet (325 mg total) by mouth 2 (two) times daily with a meal.   haloperidol decanoate 100 MG/ML injection Commonly known as:  HALDOL  DECANOATE Inject 75 mg into the muscle every 28 (twenty-eight) days. Last injection 03/08/18   nicotine 21 mg/24hr patch Commonly known as:  NICODERM CQ - dosed in mg/24 hours Place 1 patch (21 mg total) onto the skin daily. Start taking on:  03/28/2018   traZODone 50 MG tablet Commonly known as:  DESYREL Take 25-50 mg by mouth at bedtime as needed for sleep.   trihexyphenidyl 2 MG tablet Commonly known  as:  ARTANE Take 4 mg by mouth daily.       No Known Allergies  Consultations:   Procedures/Studies:  No results found. (Echo, Carotid, EGD, Colonoscopy, ERCP)    Subjective:   Discharge Exam: Vitals:   03/26/18 1258 03/27/18 0222  BP: (!) 129/56 134/77  Pulse: 72 80  Resp: 16 18  Temp: 98.8 F (37.1 C) 98.6 F (37 C)  SpO2: 98% 98%   Vitals:   03/25/18 2029 03/26/18 0956 03/26/18 1258 03/27/18 0222  BP:  (!) 150/66 (!) 129/56 134/77  Pulse: 72  72 80  Resp: 17  16 18   Temp: 98.5 F (36.9 C)  98.8 F (37.1 C) 98.6 F (37 C)  TempSrc: Oral  Oral Oral  SpO2: 96%  98% 98%  Weight:      Height:        General: Pt is alert, awake, not in acute distress Cardiovascular: RRR, S1/S2 +, no rubs, no gallops Respiratory: CTA bilaterally, no wheezing, no rhonchi Abdominal: Soft, NT, ND, bowel sounds + Extremities: no edema, no cyanosis    The results of significant diagnostics from this hospitalization (including imaging, microbiology, ancillary and laboratory) are listed below for reference.     Microbiology: No results found for this or any previous visit (from the past 240 hour(s)).   Labs: BNP (last 3 results) No results for input(s): BNP in the last 8760 hours. Basic Metabolic Panel: Recent Labs  Lab 03/24/18 1656 03/24/18 2000 03/25/18 1108 03/26/18 0839 03/27/18 0519  NA 141  --  140 138 135  K 3.0*  --  3.6 2.9* 4.2  CL 106  --  109 109 108  CO2 22  --  22 21* 20*  GLUCOSE 96  --  86 81 83  BUN 9  --  8 <5* <5*  CREATININE 0.85  --  0.73 0.54 0.57  CALCIUM 8.6*  --  8.4* 8.2* 8.8*  MG  --  2.3  --   --   --    Liver Function Tests: Recent Labs  Lab 03/24/18 1656  AST 24  ALT 17  ALKPHOS 114  BILITOT 0.2*  PROT 6.7  ALBUMIN 3.1*   No results for input(s): LIPASE, AMYLASE in the last 168 hours. No results for input(s): AMMONIA in the last 168 hours. CBC: Recent Labs  Lab 03/24/18 1656 03/24/18 1913 03/25/18 1108  03/25/18 1832 03/26/18 0839  WBC 6.8 6.3 6.2 6.5 6.2  NEUTROABS  --  3.9  --   --  4.0  HGB 3.5* 3.3* 11.2* 11.9* 11.1*  HCT 13.8* 13.1* 35.6* 35.4* 35.4*  MCV 57.0* 56.5* 71.3* 70.1* 71.2*  PLT 316 307 237 PLATELET CLUMPS NOTED ON SMEAR, COUNT APPEARS ADEQUATE 236   Cardiac Enzymes: No results for input(s): CKTOTAL, CKMB, CKMBINDEX, TROPONINI in the last 168 hours. BNP: Invalid input(s): POCBNP CBG: Recent Labs  Lab 03/25/18 0759 03/26/18 1256 03/27/18 0758  GLUCAP 84 99 89   D-Dimer No results for input(s): DDIMER in the last 72 hours. Hgb A1c No  results for input(s): HGBA1C in the last 72 hours. Lipid Profile No results for input(s): CHOL, HDL, LDLCALC, TRIG, CHOLHDL, LDLDIRECT in the last 72 hours. Thyroid function studies No results for input(s): TSH, T4TOTAL, T3FREE, THYROIDAB in the last 72 hours.  Invalid input(s): FREET3 Anemia work up Recent Labs    03/24/18 1931  VITAMINB12 230  FOLATE 9.2  FERRITIN 3*  TIBC 455*  IRON 6*  RETICCTPCT 1.1   Urinalysis    Component Value Date/Time   COLORURINE YELLOW 03/26/2018 1310   APPEARANCEUR CLEAR 03/26/2018 1310   LABSPEC 1.011 03/26/2018 1310   PHURINE 7.0 03/26/2018 1310   GLUCOSEU NEGATIVE 03/26/2018 1310   HGBUR NEGATIVE 03/26/2018 1310   BILIRUBINUR NEGATIVE 03/26/2018 1310   KETONESUR NEGATIVE 03/26/2018 1310   PROTEINUR NEGATIVE 03/26/2018 1310   NITRITE NEGATIVE 03/26/2018 1310   LEUKOCYTESUR NEGATIVE 03/26/2018 1310   Sepsis Labs Invalid input(s): PROCALCITONIN,  WBC,  LACTICIDVEN Microbiology No results found for this or any previous visit (from the past 240 hour(s)).   Time coordinating discharge: Over 30 minutes  SIGNED:   Alwyn Ren, MD  [facility] 03/27/2018, 9:52 AM Pager   If 7PM-7AM, please contact night-coverage [email or web address] Password [email or web address]

## 2018-03-27 NOTE — Care Management Note (Signed)
Case Management Note  Patient Details  Name: Janice Castro MRN: [id number] Date of Birth: Sep 28, 1936  Subjective/Objective:                    Action/Plan: Pt discharged home with self care. Pt has PCP: Dr Mikeal HawthorneGarba, per daughter. Family to provide transportation home.   Expected Discharge Date:  03/27/18               Expected Discharge Plan:  Home/Self Care  In-House Referral:     Discharge planning Services     Post Acute Care Choice:    Choice offered to:     DME Arranged:    DME Agency:     HH Arranged:    HH Agency:     Status of Service:  Completed, signed off  If discussed at [employer] Length of Stay Meetings, dates discussed:    Additional Comments:  Kermit BaloKelli F Edy Belt, RN 03/27/2018, 2:51 PM

## 2018-03-27 NOTE — Progress Notes (Signed)
Nsg Discharge Note  Admit Date:  03/24/2018 Discharge date: 03/27/2018   Janice Castro to be D/C'd Home per MD order.  AVS completed.  Copy for chart, and copy for patient signed, and dated. Patient/caregiver able to verbalize understanding.  Discharge Medication: Allergies as of 03/27/2018   No Known Allergies     Medication List    STOP taking these medications   aspirin EC 325 MG tablet     TAKE these medications   amLODipine 10 MG tablet Commonly known as:  NORVASC Take 10 mg by mouth daily.   ferrous sulfate 325 (65 FE) MG tablet Take 1 tablet (325 mg total) by mouth 2 (two) times daily with a meal.   haloperidol decanoate 100 MG/ML injection Commonly known as:  HALDOL DECANOATE Inject 75 mg into the muscle every 28 (twenty-eight) days. Last injection 03/08/18   nicotine 21 mg/24hr patch Commonly known as:  NICODERM CQ - dosed in mg/24 hours Place 1 patch (21 mg total) onto the skin daily. Start taking on:  03/28/2018   traZODone 50 MG tablet Commonly known as:  DESYREL Take 25-50 mg by mouth at bedtime as needed for sleep.   trihexyphenidyl 2 MG tablet Commonly known as:  ARTANE Take 4 mg by mouth daily.       Discharge Assessment: Vitals:   03/26/18 1258 03/27/18 0222  BP: (!) 129/56 134/77  Pulse: 72 80  Resp: 16 18  Temp: 98.8 F (37.1 C) 98.6 F (37 C)  SpO2: 98% 98%   Skin clean, dry and intact without evidence of skin break down, no evidence of skin tears noted. IV catheter discontinued intact. Site without signs and symptoms of complications - no redness or edema noted at insertion site, patient denies c/o pain - only slight tenderness at site.  Dressing with slight pressure applied.  D/c Instructions-Education: Discharge instructions given to patient/family with verbalized understanding. D/c education completed with patient/family including follow up instructions, medication list, d/c activities limitations if indicated, with other d/c  instructions as indicated by MD - patient able to verbalize understanding, all questions fully answered. Patient instructed to return to ED, call 911, or call MD for any changes in condition.  Patient escorted via WC, and D/C home via private auto.  Janice FlamingVicki L Markia Kyer, RN 03/27/2018 2:25 PM

## 2018-03-27 NOTE — Progress Notes (Signed)
CSW received consult regarding patient's daughter requesting HCPOA paperwork. Unfortunately, patient is documented as being only oriented x2, therefore Chaplain unable to complete HCPOA paperwork. Legally, patient's daughter is automatically healthcare decision maker if patient is disoriented and no spouse/other children exist. RN aware.   CSW signing off.  Osborne Cascoadia Eura Radabaugh LCSW [phone]

## 2021-10-28 ENCOUNTER — Ambulatory Visit (INDEPENDENT_AMBULATORY_CARE_PROVIDER_SITE_OTHER): Payer: Medicare Other

## 2021-10-28 ENCOUNTER — Ambulatory Visit (HOSPITAL_COMMUNITY)
Admission: EM | Admit: 2021-10-28 | Discharge: 2021-10-28 | Disposition: A | Discharge status: Home / Self Care | Payer: Medicare Other | Attending: Physician Assistant | Admitting: Physician Assistant

## 2021-10-28 ENCOUNTER — Encounter (HOSPITAL_COMMUNITY): Payer: Self-pay

## 2021-10-28 ENCOUNTER — Other Ambulatory Visit: Payer: Self-pay

## 2021-10-28 DIAGNOSIS — R262 Difficulty in walking, not elsewhere classified: Secondary | ICD-10-CM | POA: Diagnosis not present

## 2021-10-28 IMAGING — DX DG HIP (WITH OR WITHOUT PELVIS) 3-4V BILAT
4 series · 4 of 4 positions shown · non-contrast
Comparison: None.

CLINICAL DATA: Fell 2 days ago, difficulty ambulating

EXAM:
DG HIP (WITH OR WITHOUT PELVIS) 3-4V BILAT

[pelvis ap]
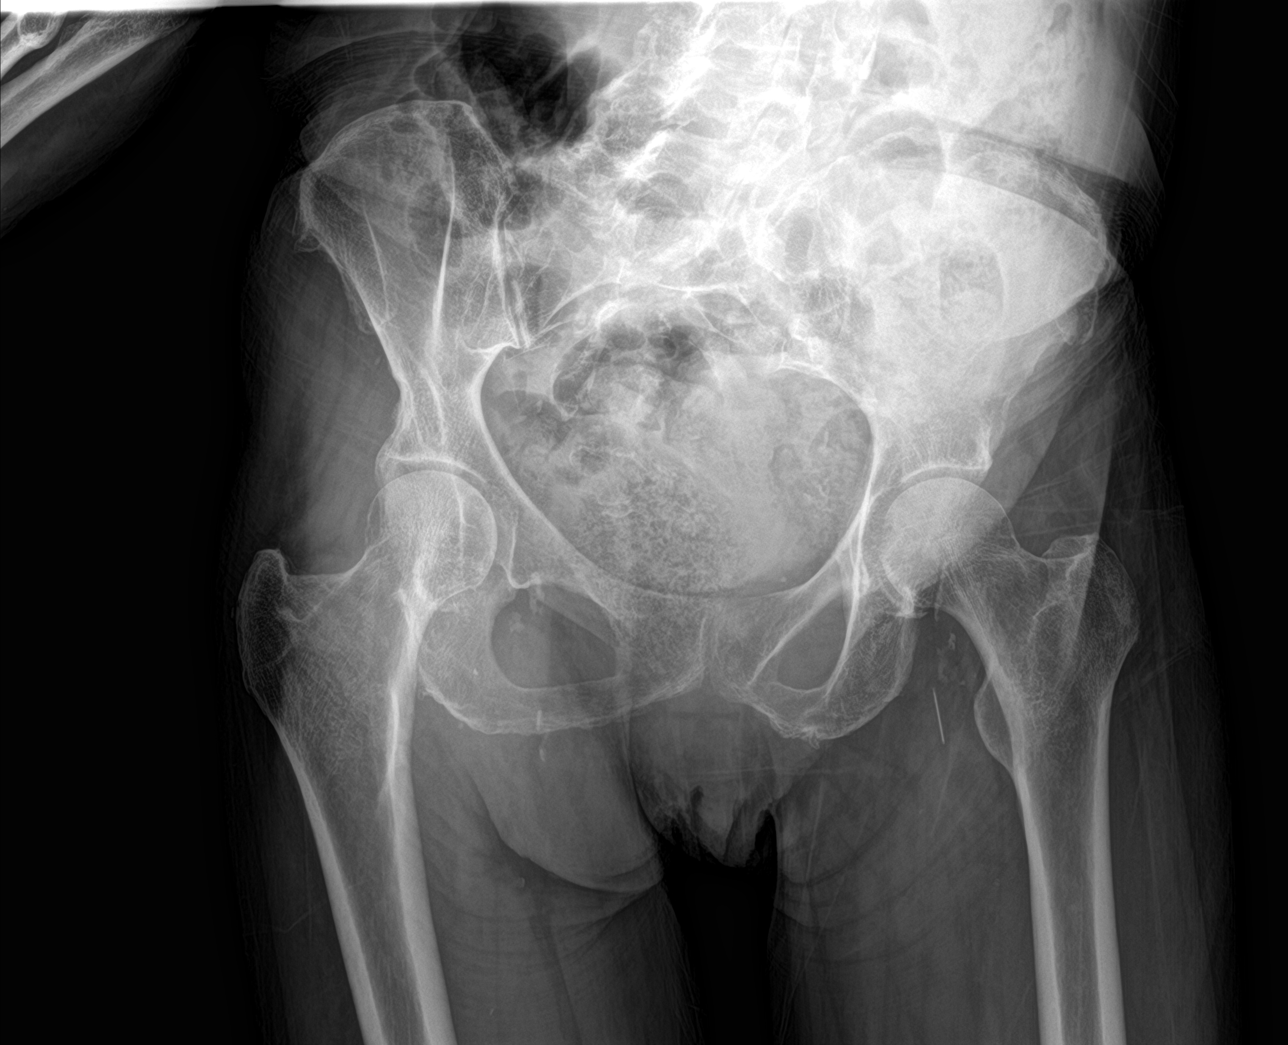

[hip ap]
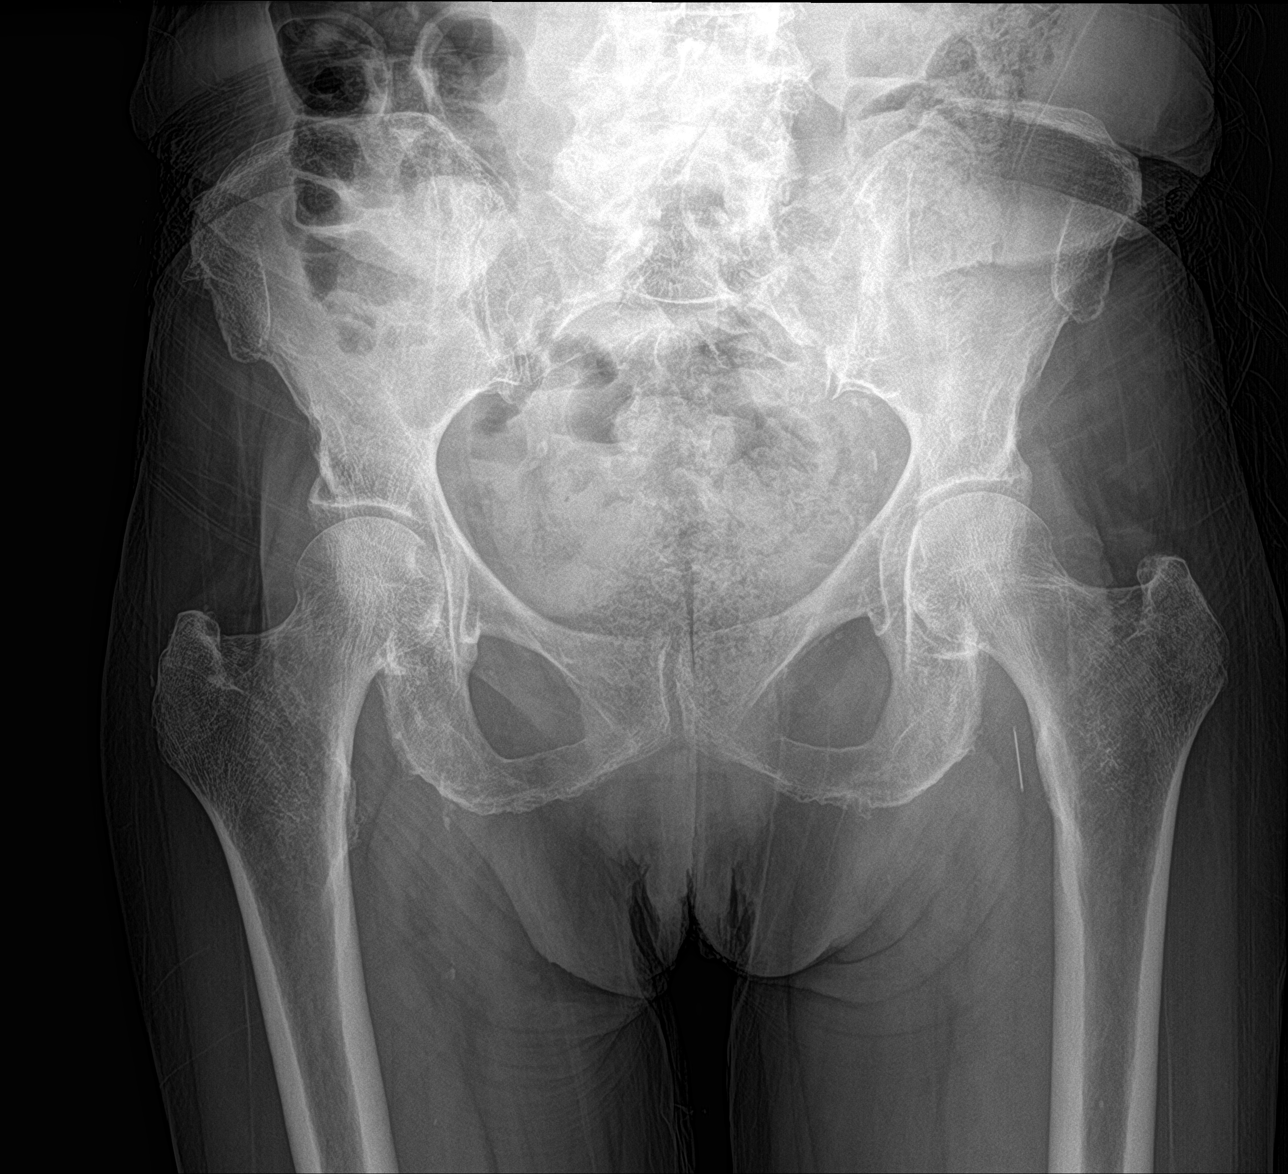

[hip lat (1 of 2)]
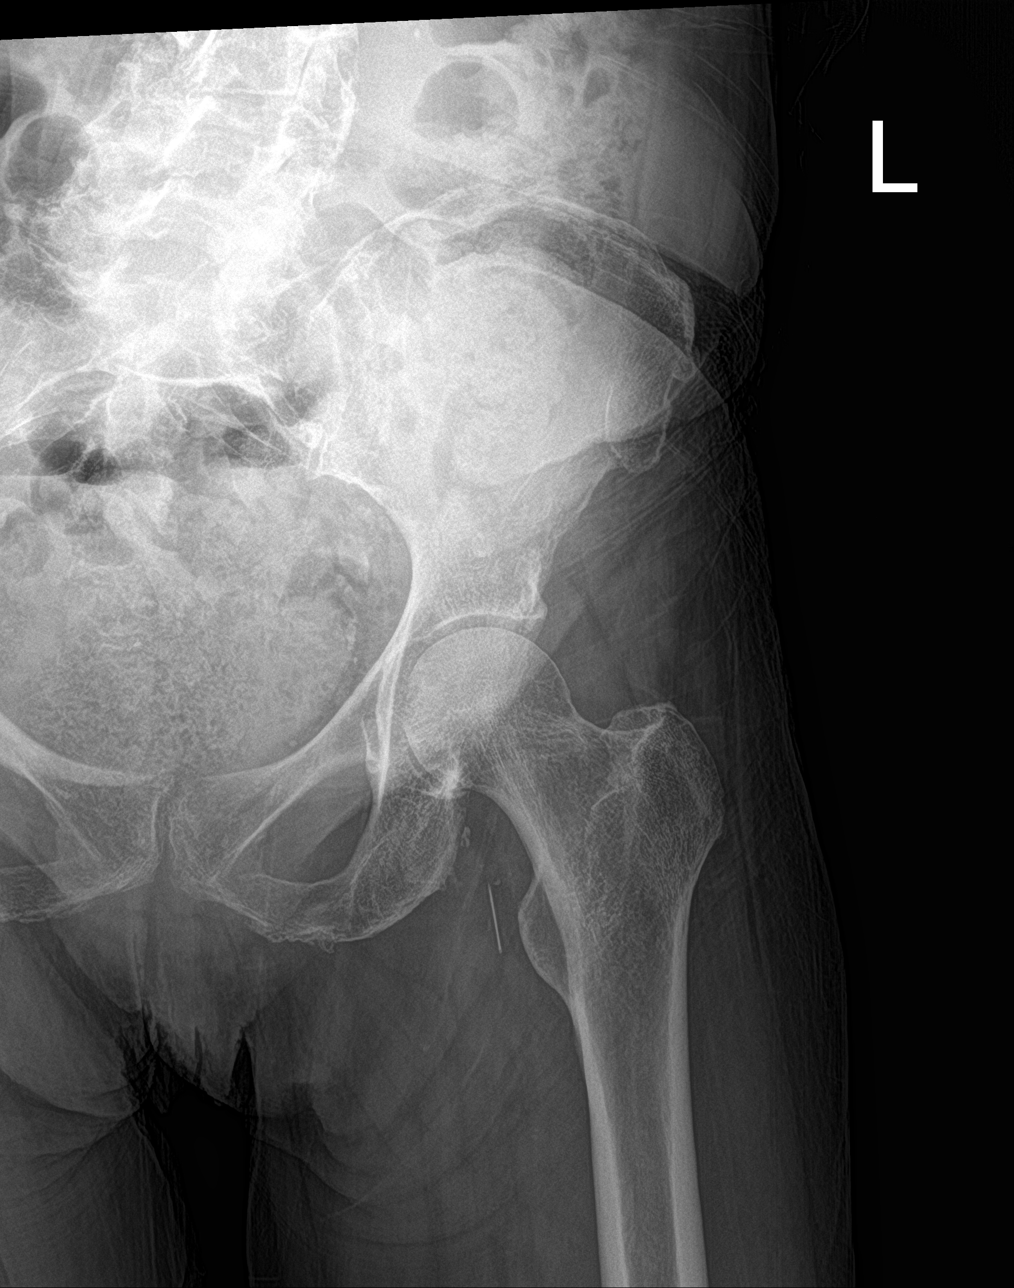

[hip lat (2 of 2)]
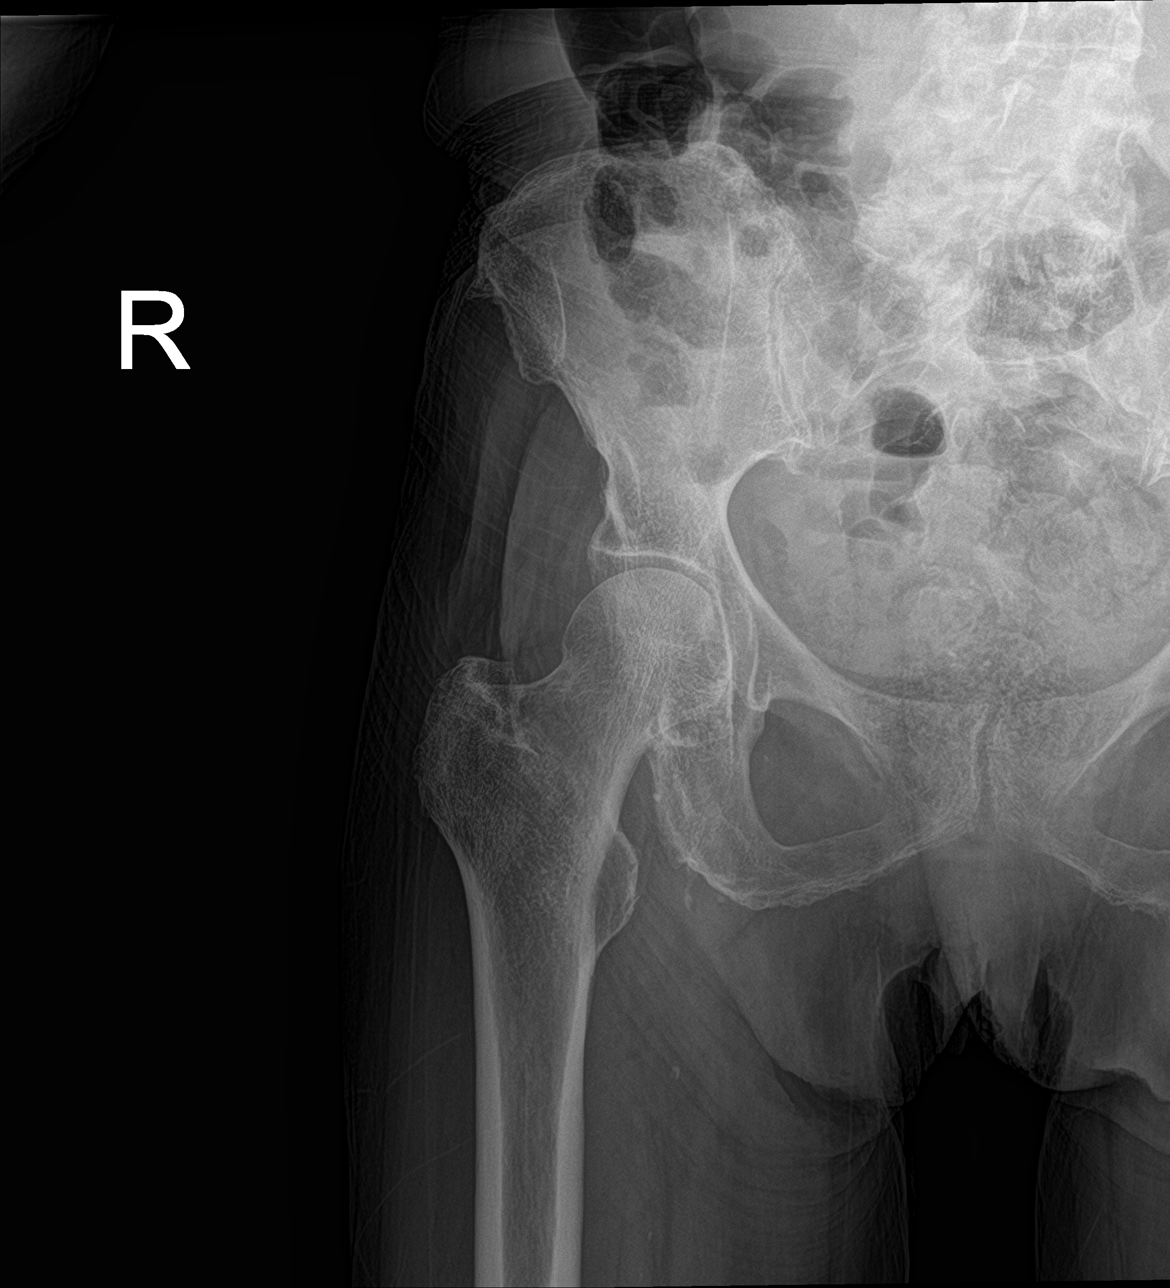

[4 of 4 positions shown; findings below may reference images not displayed]

FINDINGS: Frontal view of the pelvis as well as frogleg lateral views of both
hips are obtained. No acute fracture, subluxation, or dislocation.
Mild symmetrical bilateral hip osteoarthritis. There is a 2 cm
metallic foreign body medial to the lesser trochanter of the left
hip, consistent with retained foreign body. This is of uncertain
acuity.

There is extensive spondylosis and facet hypertrophy of the lumbar
spine. Sacroiliac joints are unremarkable.
IMPRESSION: 1. No acute displaced fracture.
2. 2 cm linear metallic foreign body adjacent to the lesser
trochanter left hip, of uncertain acuity.
3. Mild symmetrical bilateral hip osteoarthritis.
4. Severe degenerative changes of the lumbar spine.

## 2021-10-28 NOTE — ED Provider Notes (Signed)
[facility]    CSN: [id number] Arrival date & time: 10/28/21  1530      History   Chief Complaint Chief Complaint  Patient presents with   Leg Pain    HPI Janice Castro is a 85 y.o. female.   Patient presents today accompanied by her daughter help provide the majority of history.  Reports that she is a poor historian due to her history of schizophrenia.  Daughter reports that she was having difficulty ambulating around the house today.  She is concerned that she fell she was not found in a down position so must have gotten up on her own.  Patient denies any significant pain but is having difficulty ambulating unassisted.  Patient denies any history of head injury but this is unclear given she has difficulty providing history.  Daughter denies any nausea or vomiting or increased confusion.  She does not take any blood thinning medication.  She has not been given any medication for symptom management.  Daughter is interested in getting x-ray given recent difficulty ambulating to rule out occult fracture or other issue.   Past Medical History:  Diagnosis Date   Hypertension    Schizophrenia (HCC)    Tobacco abuse     Patient Active Problem List   Diagnosis Date Noted   Symptomatic anemia 03/24/2018   Hypokalemia 03/24/2018   Schizophrenia (HCC)    Hypertension    Tobacco abuse     History reviewed. No pertinent surgical history.  OB History   No obstetric history on file.      Home Medications    Prior to Admission medications   Medication Sig Start Date End Date Taking? Authorizing Provider  amLODipine (NORVASC) 10 MG tablet Take 10 mg by mouth daily.    [provider]  ferrous sulfate 325 (65 FE) MG tablet Take 1 tablet (325 mg total) by mouth 2 (two) times daily with a meal. 03/27/18   Alwyn Ren, MD  haloperidol decanoate (HALDOL DECANOATE) 100 MG/ML injection Inject 75 mg into the muscle every 28 (twenty-eight) days. Last injection  03/08/18    [provider]  nicotine (NICODERM CQ - DOSED IN MG/24 HOURS) 21 mg/24hr patch Place 1 patch (21 mg total) onto the skin daily. 03/28/18   Alwyn Ren, MD  traZODone (DESYREL) 50 MG tablet Take 25-50 mg by mouth at bedtime as needed for sleep.    [provider]  trihexyphenidyl (ARTANE) 2 MG tablet Take 4 mg by mouth daily.    [provider]    Family History History reviewed. No pertinent family history.  Social History Social History   Tobacco Use   Smoking status: Every Day    Packs/day: 1.00    Types: Cigarettes   Smokeless tobacco: Never  Substance Use Topics   Alcohol use: Not Currently   Drug use: Not Currently     Allergies   Patient has no known allergies.   Review of Systems Review of Systems  Constitutional:  Positive for activity change. Negative for appetite change, fatigue and fever.  Respiratory:  Negative for cough and shortness of breath.   Cardiovascular:  Negative for chest pain.  Gastrointestinal:  Negative for abdominal pain, diarrhea, nausea and vomiting.  Musculoskeletal:  Positive for arthralgias and gait problem. Negative for myalgias.  Neurological:  Negative for dizziness, light-headedness and headaches.    Physical Exam Triage Vital Signs ED Triage Vitals  Enc Vitals Group     BP 10/28/21  1743 (!) 145/71     Pulse Rate 10/28/21 1743 87     Resp 10/28/21 1743 16     Temp 10/28/21 1743 98.4 F (36.9 C)     Temp Source 10/28/21 1743 Oral     SpO2 10/28/21 1743 98 %     Weight --      Height --      Head Circumference --      Peak Flow --      Pain Score 10/28/21 1746 0     Pain Loc --      Pain Edu? --      Excl. in GC? --    No data found.  Updated Vital Signs BP (!) 145/71 (BP Location: Left Arm)   Pulse 87   Temp 98.4 F (36.9 C) (Oral)   Resp 16   SpO2 98%   Visual Acuity Right Eye Distance:   Left Eye Distance:   Bilateral Distance:    Right Eye Near:   Left Eye Near:     Bilateral Near:     Physical Exam Vitals reviewed.  Constitutional:      General: She is awake. She is not in acute distress.    Appearance: Normal appearance. She is well-developed. She is not ill-appearing.     Comments: Very pleasant female appears stated age sitting comfortably in wheelchair no acute distress  HENT:     Head: Normocephalic and atraumatic.     Right Ear: No hemotympanum.     Left Ear: No hemotympanum.     Nose: Nose normal.     Mouth/Throat:     Pharynx: Uvula midline. No oropharyngeal exudate or posterior oropharyngeal erythema.  Eyes:     Pupils: Pupils are equal, round, and reactive to light.     Comments: Arcus senilis bilaterally  Cardiovascular:     Rate and Rhythm: Normal rate and regular rhythm.     Heart sounds: Normal heart sounds, S1 normal and S2 normal. No murmur heard. Pulmonary:     Effort: Pulmonary effort is normal.     Breath sounds: Wheezing present. No rhonchi or rales.     Comments: Scattered wheezing Abdominal:     Palpations: Abdomen is soft.     Tenderness: There is no abdominal tenderness.  Musculoskeletal:     Cervical back: Normal range of motion and neck supple.     Right hip: No bony tenderness. Normal range of motion.     Left hip: No bony tenderness. Normal range of motion.     Right upper leg: No swelling, edema or deformity.     Left upper leg: No swelling, edema or deformity.     Right knee: No swelling or bony tenderness. No tenderness.     Left knee: No swelling or bony tenderness. No tenderness.     Comments: Patient is able to stand and ambulate with assistance.  No significant separation of her major joints.  Normal passive range of motion at knee and ankle.  No deformity noted.  No bruising or swelling noted.  Psychiatric:        Behavior: Behavior is cooperative.     UC Treatments / Results  Labs (all labs ordered are listed, but only abnormal results are displayed) Labs Reviewed - No data to  display  EKG   Radiology DG Hips Bilat W or Wo Pelvis 3-4 Views  Result Date: 10/28/2021 CLINICAL DATA:  Larey Seat 2 days ago, difficulty ambulating EXAM: DG HIP (WITH OR WITHOUT  PELVIS) 3-4V BILAT COMPARISON:  None. FINDINGS: Frontal view of the pelvis as well as frogleg lateral views of both hips are obtained. No acute fracture, subluxation, or dislocation. Mild symmetrical bilateral hip osteoarthritis. There is a 2 cm metallic foreign body medial to the lesser trochanter of the left hip, consistent with retained foreign body. This is of uncertain acuity. There is extensive spondylosis and facet hypertrophy of the lumbar spine. Sacroiliac joints are unremarkable. IMPRESSION: 1. No acute displaced fracture. 2. 2 cm linear metallic foreign body adjacent to the lesser trochanter left hip, of uncertain acuity. 3. Mild symmetrical bilateral hip osteoarthritis. 4. Severe degenerative changes of the lumbar spine. Electronically Signed   By: Sharlet Salina M.D.   On: 10/28/2021 18:29    Procedures Procedures (including critical care time)  Medications Ordered in UC Medications - No data to display  Initial Impression / Assessment and Plan / UC Course  I have reviewed the triage vital signs and the nursing notes.  Pertinent labs & imaging results that were available during my care of the patient were reviewed by me and considered in my medical decision making (see chart for details).     X-ray hip given difficulty standing straight which showed degenerative changes without acute findings.  A linear metallic foreign body was noted we discussed this with daughter who was unaware of this.  Recommended follow-up with PCP as we typically do not attempt to remove foreign bodies that are not superficial.  Daughter expressed some frustration as she was hoping to get x-rays of the entirety of her mom's legs discussed this will be a lot of films, high cost, significant radiation.  She has normal active and passive  range of motion at the knee and ankle and is able to bear weight with assistance so unlikely to have any occult fracture.  Recommended follow-up with specialist and was given contact information for sports medicine provider.  She can use Tylenol for pain.  Discussed the importance of following up with specialist and/or PCP for further evaluation and management.  Also discussed that if there is any concern for fall that involved a head injury she would need to go to the emergency room; daughter denies any concerns with this but we discussed that if she has any concerning symptoms including nausea, vomiting, increased confusion she should be evaluated.  Discussed alarm symptoms that warrant monitor evaluation.  Strict return precautions given to which daughter expressed understanding.  Final Clinical Impressions(s) / UC Diagnoses   Final diagnoses:  Difficulty walking   Discharge Instructions   None    ED Prescriptions   None    PDMP not reviewed this encounter.   Jeani Hawking, PA-C 10/28/21 1913

## 2021-10-28 NOTE — ED Triage Notes (Signed)
Per pt Daughter pt is unable to walk. Pt states she fall  two days ago.

## 2021-11-07 ENCOUNTER — Emergency Department (HOSPITAL_COMMUNITY): Payer: Medicare Other

## 2021-11-07 ENCOUNTER — Emergency Department (HOSPITAL_COMMUNITY)
Admission: EM | Admit: 2021-11-07 | Discharge: 2021-11-08 | Disposition: A | Discharge status: Home / Self Care | Payer: Medicare Other | Attending: Emergency Medicine | Admitting: Emergency Medicine

## 2021-11-07 ENCOUNTER — Other Ambulatory Visit: Payer: Self-pay

## 2021-11-07 ENCOUNTER — Encounter (HOSPITAL_COMMUNITY): Payer: Self-pay | Admitting: Emergency Medicine

## 2021-11-07 DIAGNOSIS — F0390 Unspecified dementia without behavioral disturbance: Secondary | ICD-10-CM | POA: Diagnosis not present

## 2021-11-07 DIAGNOSIS — R791 Abnormal coagulation profile: Secondary | ICD-10-CM | POA: Insufficient documentation

## 2021-11-07 DIAGNOSIS — I10 Essential (primary) hypertension: Secondary | ICD-10-CM | POA: Diagnosis not present

## 2021-11-07 DIAGNOSIS — Z79899 Other long term (current) drug therapy: Secondary | ICD-10-CM | POA: Insufficient documentation

## 2021-11-07 DIAGNOSIS — R531 Weakness: Secondary | ICD-10-CM | POA: Insufficient documentation

## 2021-11-07 DIAGNOSIS — R410 Disorientation, unspecified: Secondary | ICD-10-CM | POA: Insufficient documentation

## 2021-11-07 DIAGNOSIS — F17210 Nicotine dependence, cigarettes, uncomplicated: Secondary | ICD-10-CM | POA: Diagnosis not present

## 2021-11-07 DIAGNOSIS — R270 Ataxia, unspecified: Secondary | ICD-10-CM | POA: Diagnosis present

## 2021-11-07 LAB — I-STAT CHEM 8, ED
BUN: 9 mg/dL (ref 8–23)
Calcium, Ion: 1.17 mmol/L (ref 1.15–1.40)
Chloride: 105 mmol/L (ref 98–111)
Creatinine, Ser: 0.6 mg/dL (ref 0.44–1.00)
Glucose, Bld: 94 mg/dL (ref 70–99)
HCT: 38 % (ref 36.0–46.0)
Hemoglobin: 12.9 g/dL (ref 12.0–15.0)
Potassium: 3.2 mmol/L — ABNORMAL LOW (ref 3.5–5.1)
Sodium: 141 mmol/L (ref 135–145)
TCO2: 24 mmol/L (ref 22–32)

## 2021-11-07 LAB — DIFFERENTIAL
Abs Immature Granulocytes: 0.03 10*3/uL (ref 0.00–0.07)
Basophils Absolute: 0 10*3/uL (ref 0.0–0.1)
Basophils Relative: 0 %
Eosinophils Absolute: 0.1 10*3/uL (ref 0.0–0.5)
Eosinophils Relative: 1 %
Immature Granulocytes: 0 %
Lymphocytes Relative: 23 %
Lymphs Abs: 1.7 10*3/uL (ref 0.7–4.0)
Monocytes Absolute: 0.8 10*3/uL (ref 0.1–1.0)
Monocytes Relative: 10 %
Neutro Abs: 4.8 10*3/uL (ref 1.7–7.7)
Neutrophils Relative %: 66 %

## 2021-11-07 LAB — COMPREHENSIVE METABOLIC PANEL
ALT: 31 U/L (ref 0–44)
AST: 70 U/L — ABNORMAL HIGH (ref 15–41)
Albumin: 4.3 g/dL (ref 3.5–5.0)
Alkaline Phosphatase: 96 U/L (ref 38–126)
Anion gap: 11 (ref 5–15)
BUN: 12 mg/dL (ref 8–23)
CO2: 23 mmol/L (ref 22–32)
Calcium: 9.4 mg/dL (ref 8.9–10.3)
Chloride: 104 mmol/L (ref 98–111)
Creatinine, Ser: 0.65 mg/dL (ref 0.44–1.00)
GFR, Estimated: >60 mL/min (ref >60)
Glucose, Bld: 95 mg/dL (ref 70–99)
Potassium: 3.2 mmol/L — ABNORMAL LOW (ref 3.5–5.1)
Sodium: 138 mmol/L (ref 135–145)
Total Bilirubin: 0.8 mg/dL (ref 0.3–1.2)
Total Protein: 7.2 g/dL (ref 6.5–8.1)

## 2021-11-07 LAB — CBC
HCT: 36.8 % (ref 36.0–46.0)
Hemoglobin: 12.5 g/dL (ref 12.0–15.0)
MCH: 34.6 pg — ABNORMAL HIGH (ref 26.0–34.0)
MCHC: 34 g/dL (ref 30.0–36.0)
MCV: 101.9 fL — ABNORMAL HIGH (ref 80.0–100.0)
Platelets: 279 10*3/uL (ref 150–400)
RBC: 3.61 MIL/uL — ABNORMAL LOW (ref 3.87–5.11)
RDW: 15.3 % (ref 11.5–15.5)
WBC: 7.4 10*3/uL (ref 4.0–10.5)
nRBC: 0 % (ref 0.0–0.2)

## 2021-11-07 LAB — PROTIME-INR
INR: 1 (ref 0.8–1.2)
Prothrombin Time: 13.1 seconds (ref 11.4–15.2)

## 2021-11-07 LAB — APTT: aPTT: 25 seconds (ref 24–36)

## 2021-11-07 LAB — CBG MONITORING, ED: Glucose-Capillary: 95 mg/dL (ref 70–99)

## 2021-11-07 IMAGING — MR MR HEAD W/O CM
10 series · 44 of 48 positions shown · non-contrast
Comparison: None.

CLINICAL DATA: Neuro deficit, acute, stroke suspected

EXAM:
MRI HEAD WITHOUT CONTRAST
TECHNIQUE: Multiplanar, multiecho pulse sequences of the brain and surrounding
structures were obtained without intravenous contrast.

[Series 5: dwi_tracew · axial · 3.0mm · 1.08mm/px · z∈[-53,+96]mm · 8 of 102 slices shown]
[im 1/102]
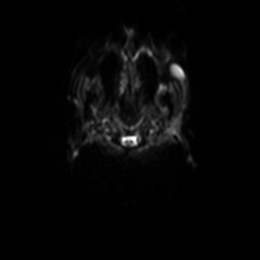
[im 19/102]
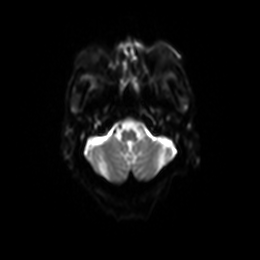
[im 28/102]
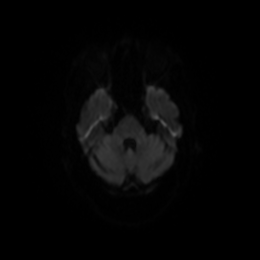
[im 46/102]
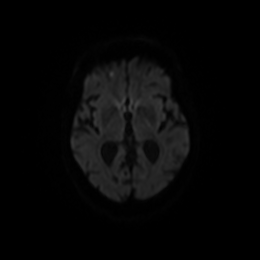
[im 56/102]
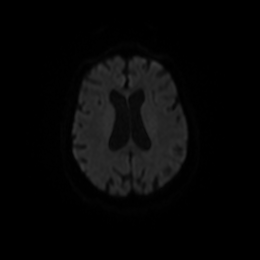
[im 74/102]
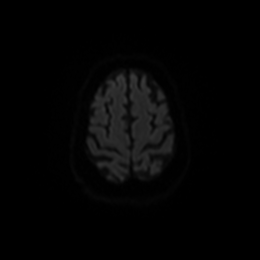
[im 83/102]
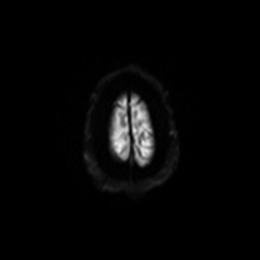
[im 102/102]
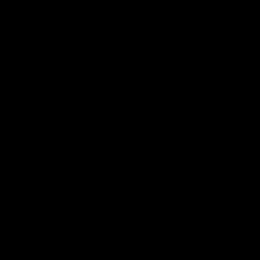

[Series 6: dwi_adc · axial · 3.0mm · 1.08mm/px · z∈[-53,+87]mm · 5 of 48 slices shown]
[im 1/48]
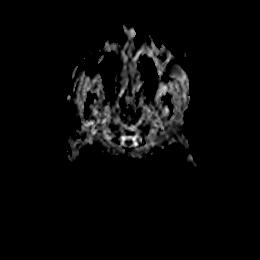
[im 12/48]
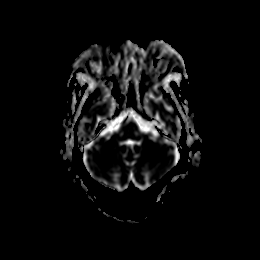
[im 24/48]
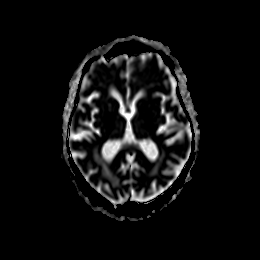
[im 36/48]
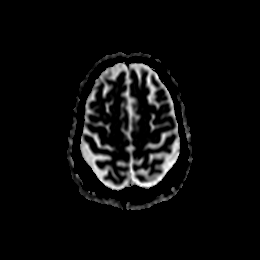
[im 48/48]
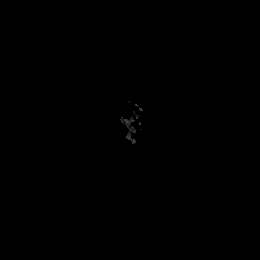

[Series 7: T2 · sagittal · 5.0mm · 0.47mm/px · 2 of 24 slices shown (1 of 3)]
[im 1/24]
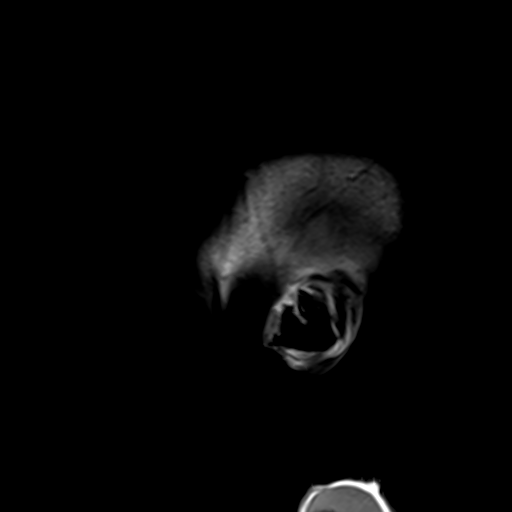
[im 24/24]
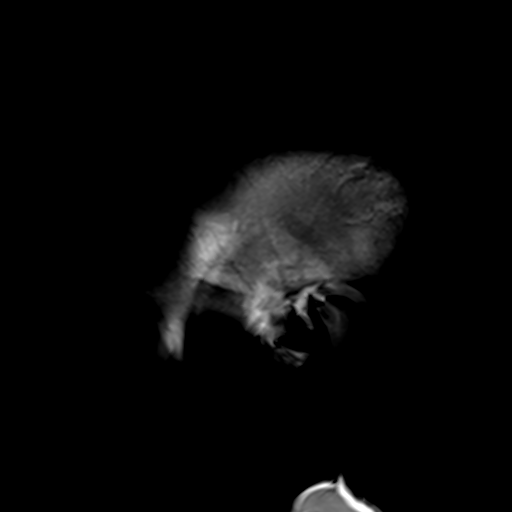

[Series 8: T2 · axial · 5.0mm · 0.45mm/px · z∈[-50,+85]mm · 2 of 22 slices shown (2 of 3)]
[im 1/22]
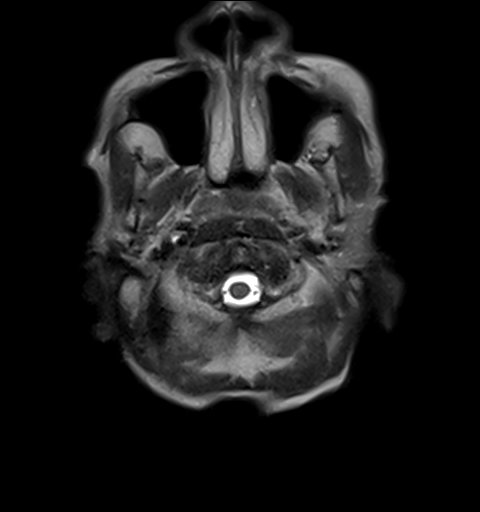
[im 22/22]
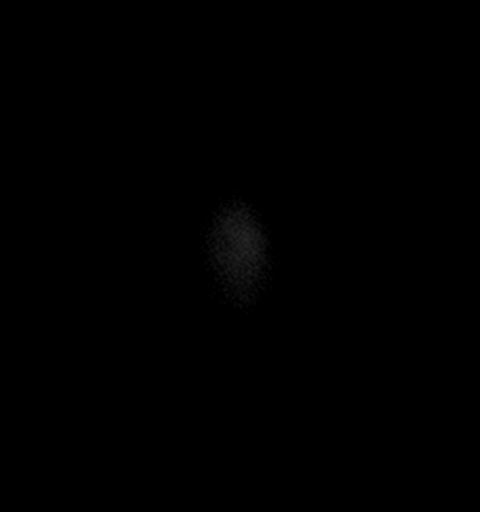

[Series 9: GRE · axial · 3.0mm · 0.45mm/px · z∈[-58,+91]mm · 5 of 51 slices shown]
[im 1/51]
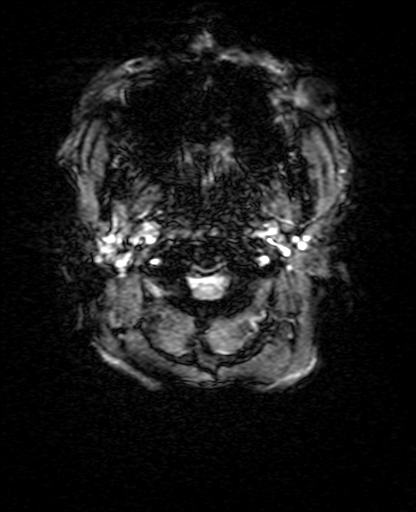
[im 13/51]
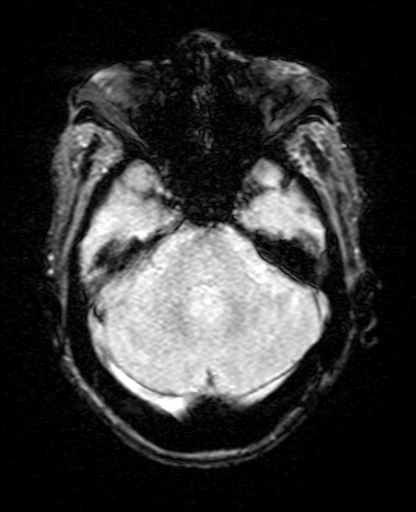
[im 26/51]
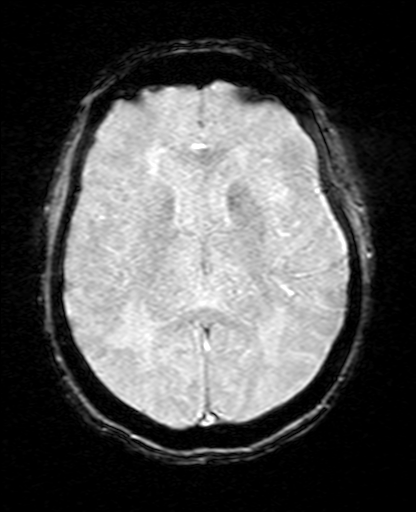
[im 38/51]
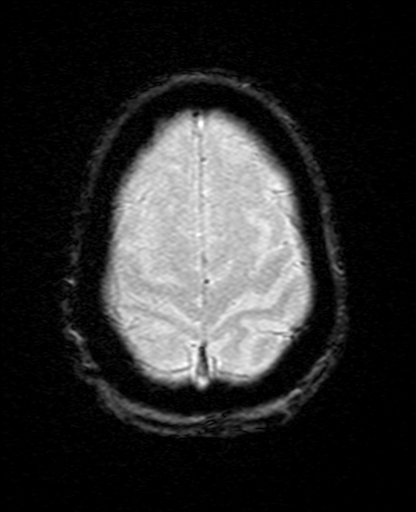
[im 51/51]
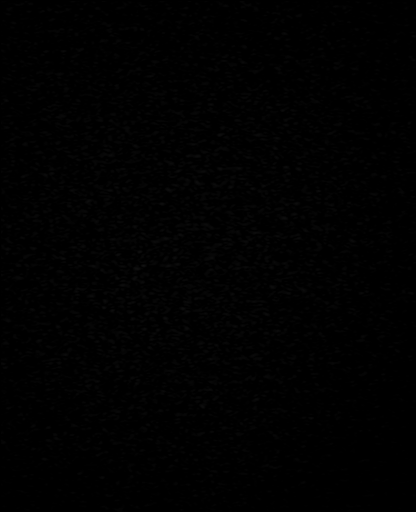

[Series 10: FLAIR · axial · 3.0mm · 0.86mm/px · z∈[-58,+91]mm · 5 of 51 slices shown]
[im 1/51]
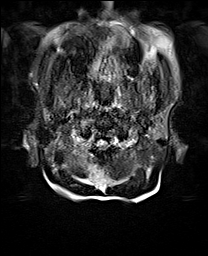
[im 13/51]
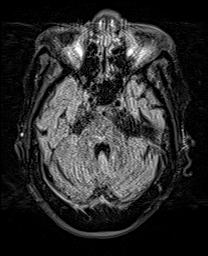
[im 26/51]
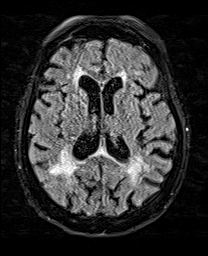
[im 38/51]
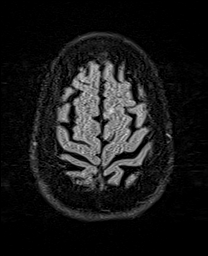
[im 51/51]
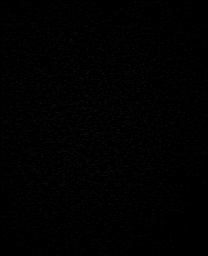

[Series 11: T1 · axial · 3.0mm · 0.45mm/px · z∈[-58,+91]mm · 5 of 51 slices shown]
[im 1/51]
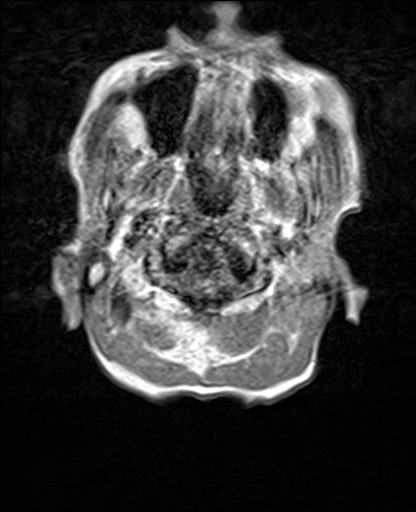
[im 13/51]
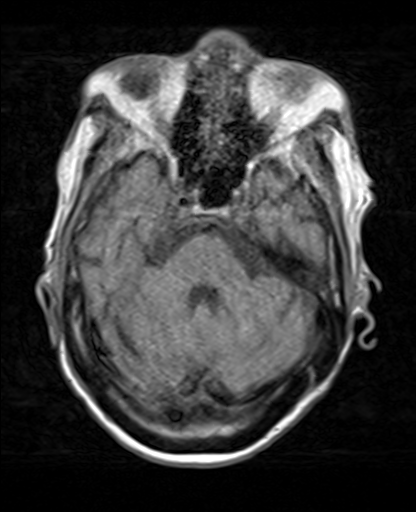
[im 26/51]
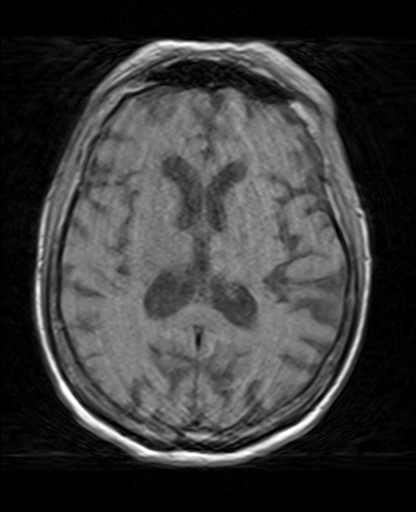
[im 38/51]
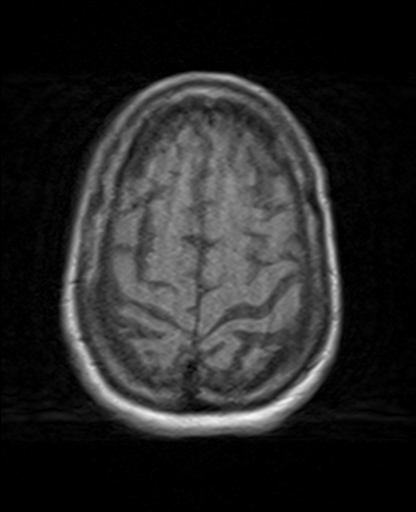
[im 51/51]
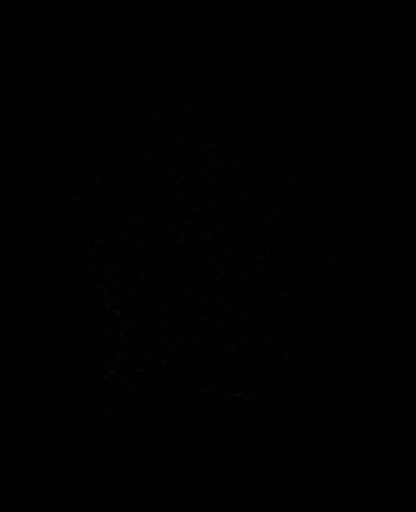

[Series 12: DWI · coronal · 5.0mm · 1.31mm/px · 6 of 56 slices shown (1 of 2)]
[im 1/56]
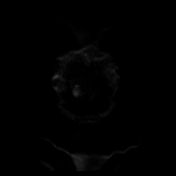
[im 12/56]
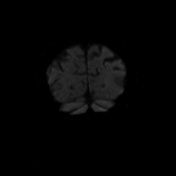
[im 23/56]
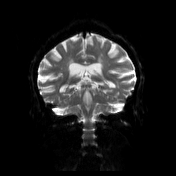
[im 34/56]
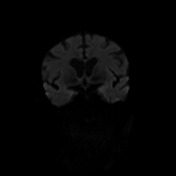
[im 45/56]
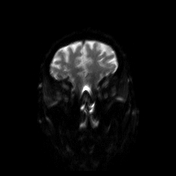
[im 56/56]
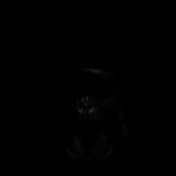

[Series 13: DWI · coronal · 5.0mm · 1.31mm/px · 3 of 28 slices shown (2 of 2)]
[im 1/28]
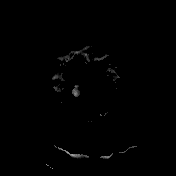
[im 14/28]
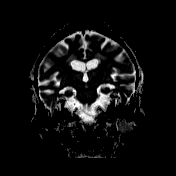
[im 28/28]
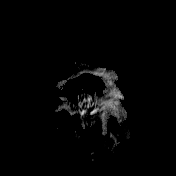

[Series 14: T2 · coronal · 5.0mm · 0.86mm/px · 3 of 28 slices shown (3 of 3)]
[im 1/28]
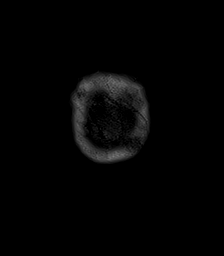
[im 14/28]
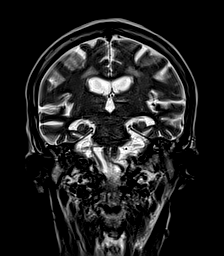
[im 28/28]
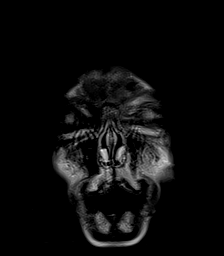

[44 of 48 positions shown; findings below may reference images not displayed]

FINDINGS: Motion artifact is present.

Brain: There is no acute infarction or intracranial hemorrhage.
There is no intracranial mass, mass effect, or edema. There is no
hydrocephalus or extra-axial fluid collection. Prominence of the
ventricles and sulci reflects parenchymal volume loss. Patchy and
confluent T2 hyperintensity in the supratentorial and pontine white
matter is nonspecific but probably reflects moderate chronic
microvascular ischemic changes.

Vascular: Major vessel flow voids at the skull base are preserved.

Skull and upper cervical spine: Normal marrow signal is preserved.

Sinuses/Orbits: Paranasal sinuses are aerated. Orbits are
unremarkable.

Other: Sella is unremarkable.  Mastoid air cells are clear.
IMPRESSION: No acute infarction, hemorrhage, or mass.

Moderate chronic microvascular ischemic changes.

## 2021-11-07 IMAGING — DX DG PORTABLE PELVIS
1 series · 1 of 1 positions shown · non-contrast
Comparison: None.

CLINICAL DATA: Fall, left upper extremity injury

EXAM:
PORTABLE PELVIS 1-2 VIEWS

[pelvis ap]
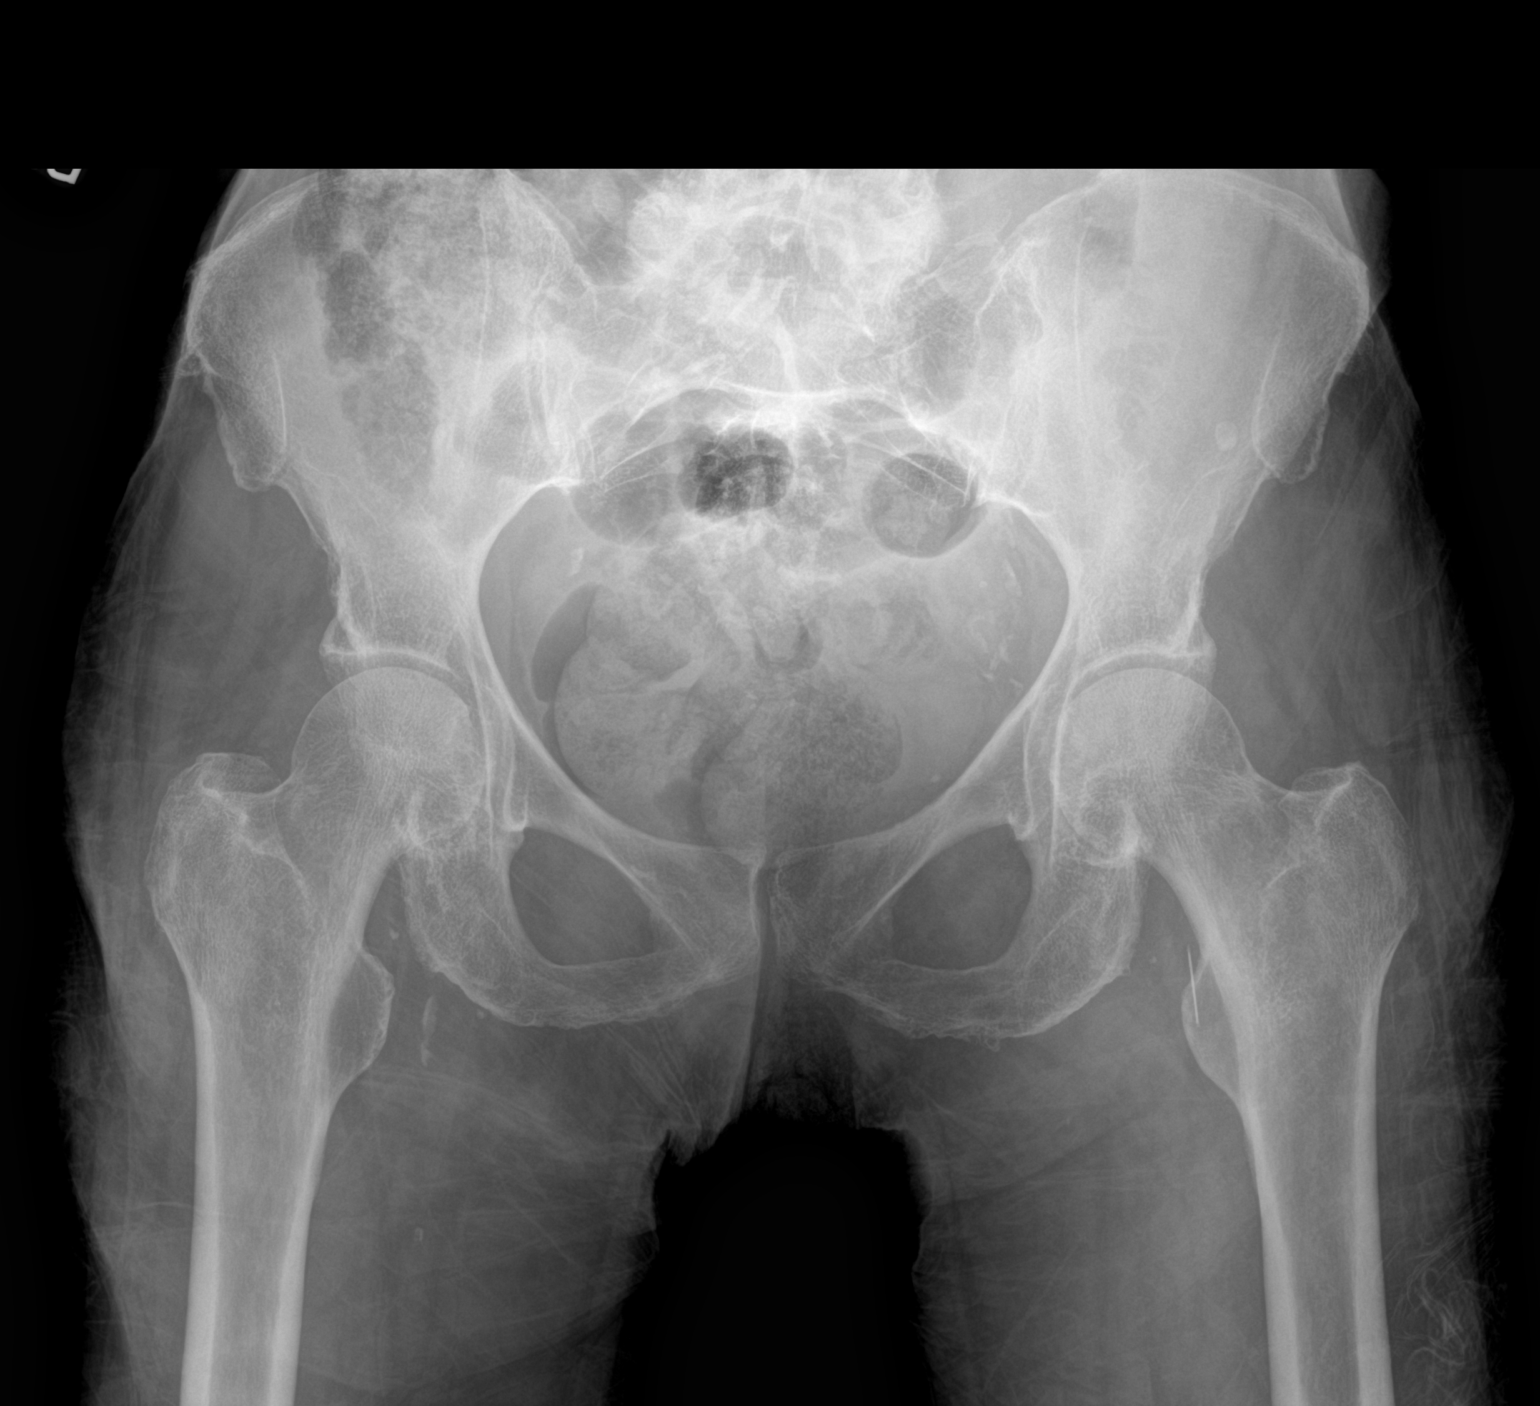

[1 of 1 positions shown; findings below may reference images not displayed]

FINDINGS: Normal alignment. No acute fracture or dislocation. A a 19 mm
needle-like linear metallic foreign body is seen overlying the left
inferior trochanter, possibly representing an object overlying the
patient or retained radiopaque foreign body. Vascular calcifications
are noted.
IMPRESSION: No acute fracture or dislocation.

Possible 19 mm needle-like foreign body within the proximal left
thigh.

## 2021-11-07 IMAGING — DX DG CHEST 1V PORT
1 series · 1 of 1 positions shown · non-contrast
Comparison: None.

CLINICAL DATA: Fall, chest injury

EXAM:
PORTABLE CHEST 1 VIEW

[chest ap]
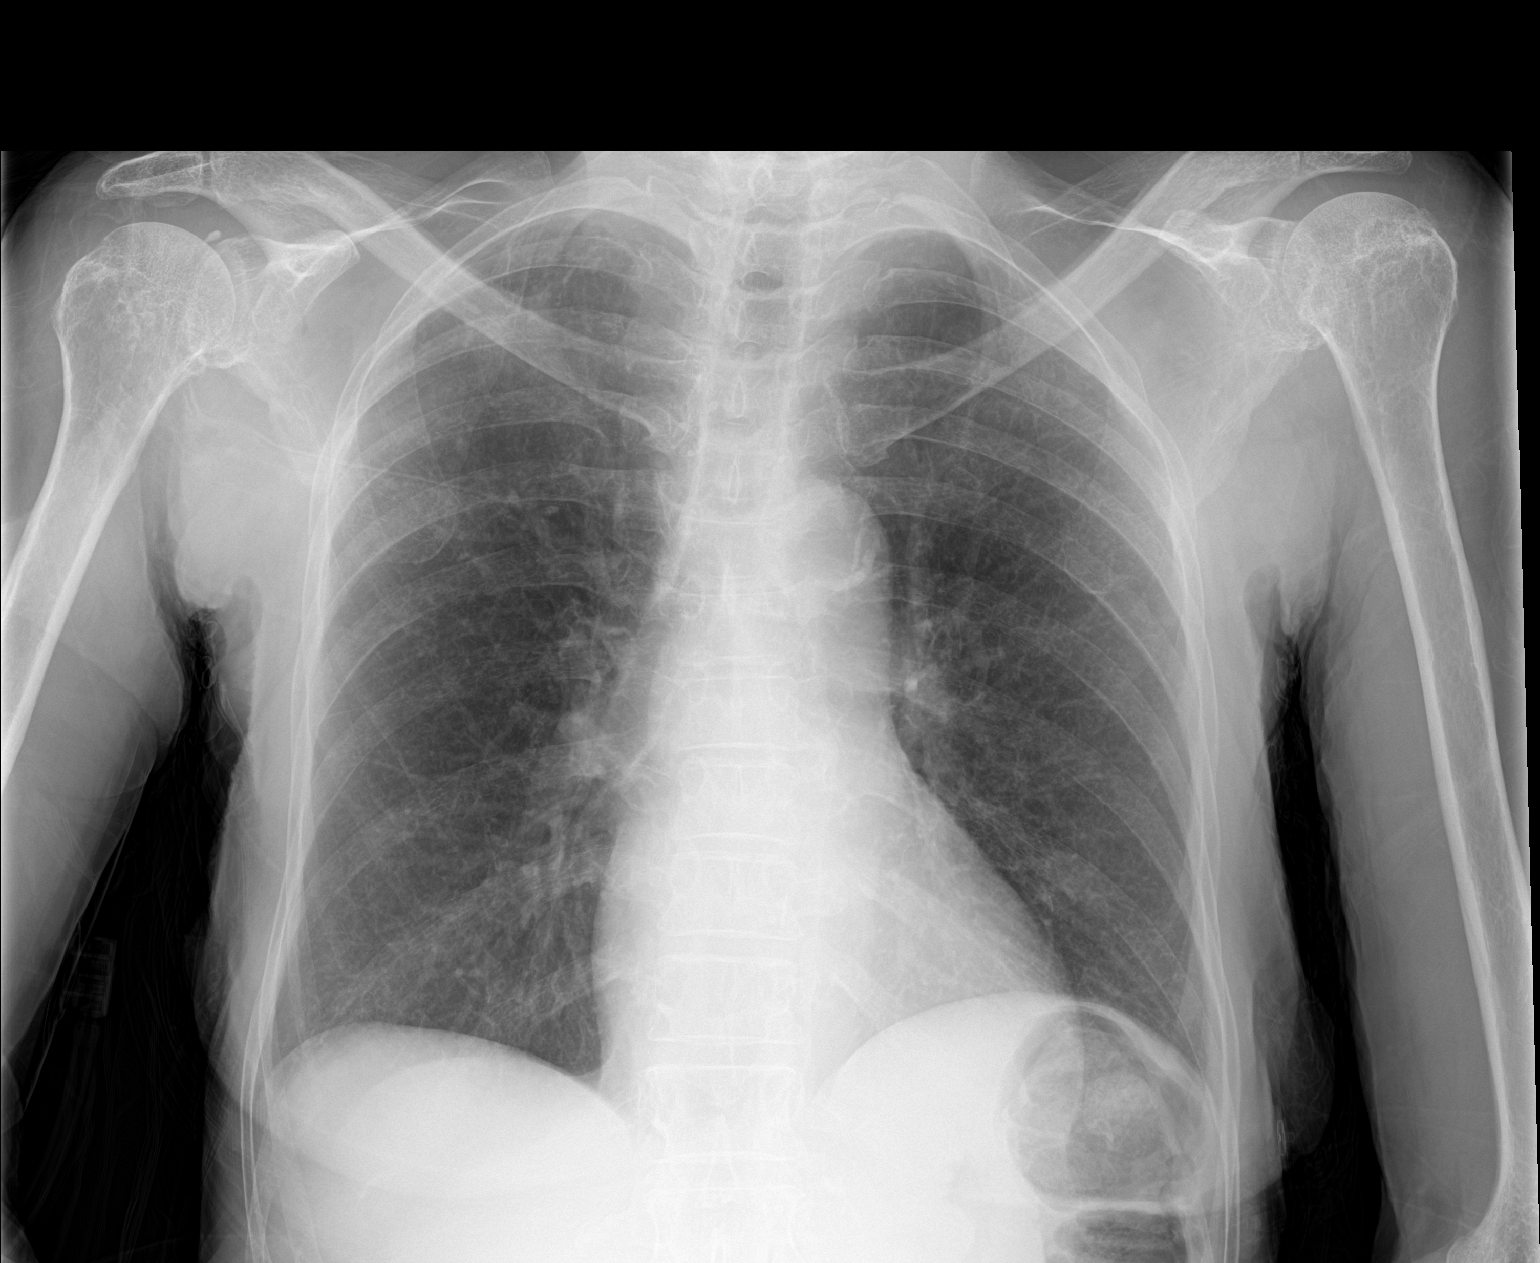

[1 of 1 positions shown; findings below may reference images not displayed]

FINDINGS: Lungs are well expanded, symmetric, and clear. No pneumothorax or
pleural effusion. Cardiac size within normal limits. Pulmonary
vascularity is normal. Osseous structures are age-appropriate. No
acute bone abnormality.
IMPRESSION: No active disease.

## 2021-11-07 IMAGING — CT CT CERVICAL SPINE W/O CM
3 of 4 series · 9 of 33 positions shown, 11 images · non-contrast
Comparison: None.

CLINICAL DATA: Neck trauma (Age >= 65y).  Multiple falls.

EXAM:
CT CERVICAL SPINE WITHOUT CONTRAST
TECHNIQUE: Multidetector CT imaging of the cervical spine was performed without
intravenous contrast. Multiplanar CT image reconstructions were also
generated.

[Series 7: orthogonal bone · axial · 0.23mm/px · z∈[-138,-138]mm · 1 of 122 slices shown, 2 images]
[im 61/122  soft-tissue]
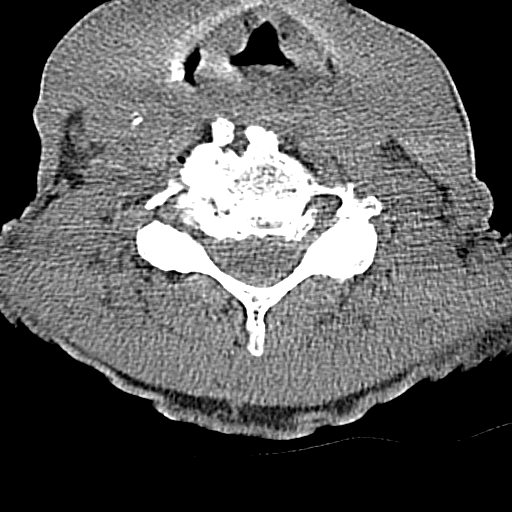
[im 61/122  bone]
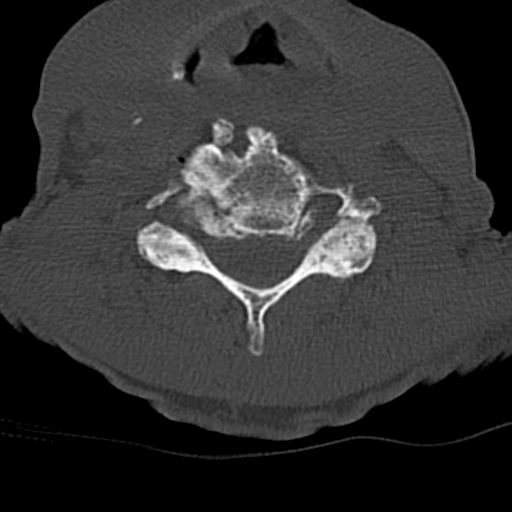

[Series 8: coronal bone · coronal · 0.23mm/px · 3 of 61 slices shown]
[im 16/61  bone]
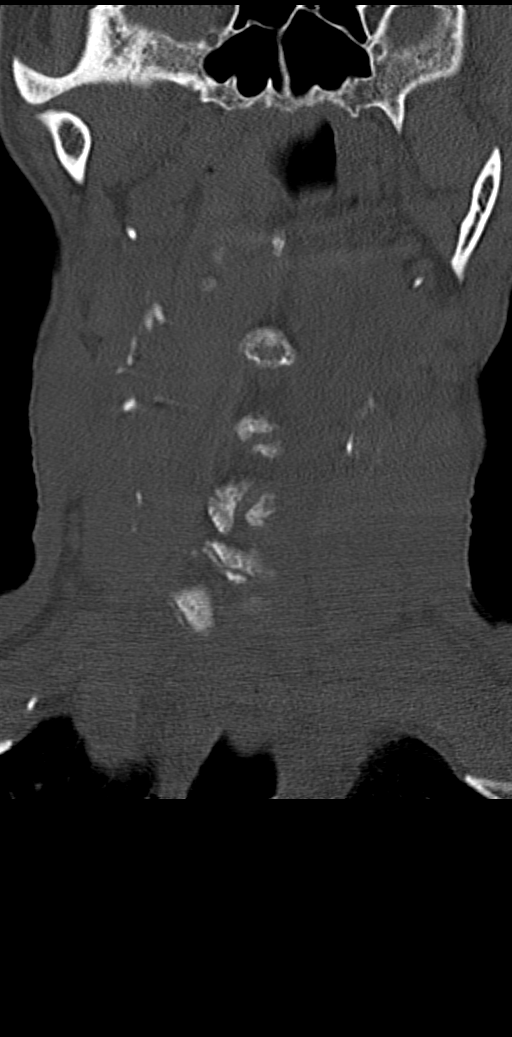
[im 26/61  bone]
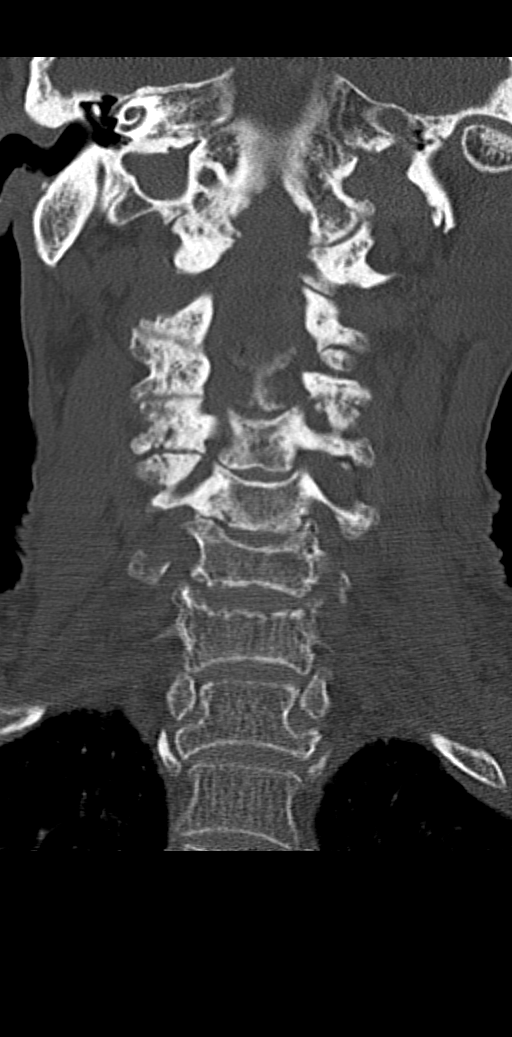
[im 35/61  bone]
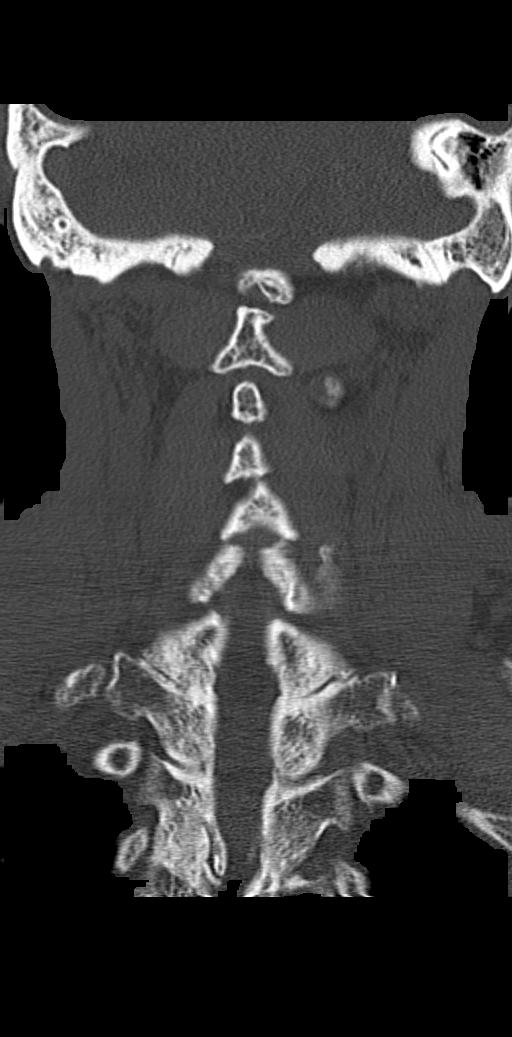

[Series 9: sagittal bone · sagittal · 0.23mm/px · 5 of 61 slices shown, 6 images]
[im 21/61  bone]
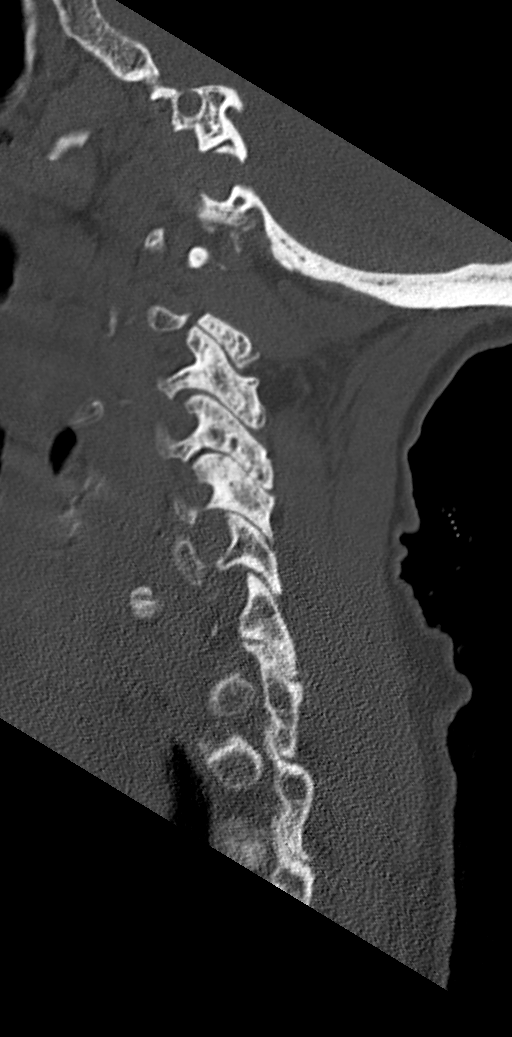
[im 26/61  bone]
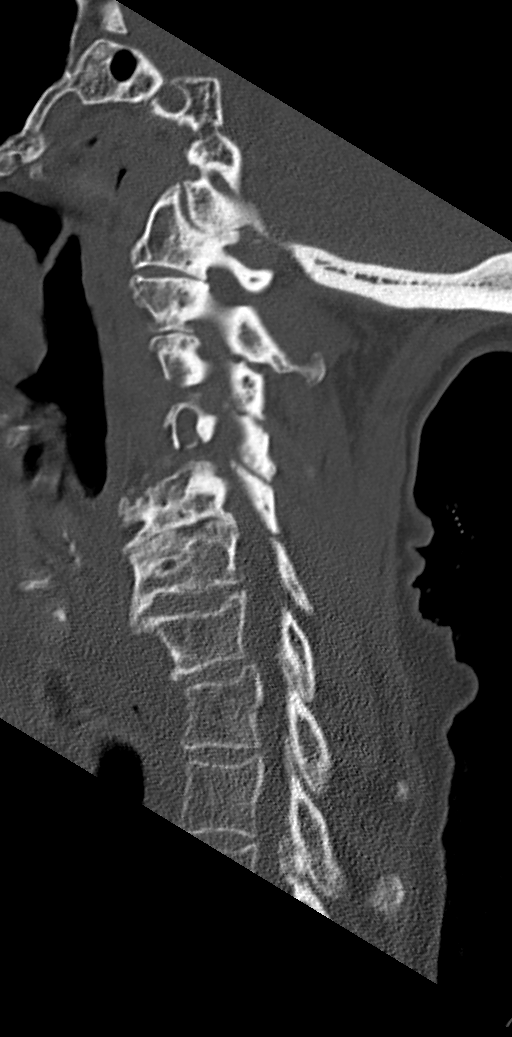
[im 31/61  soft-tissue]
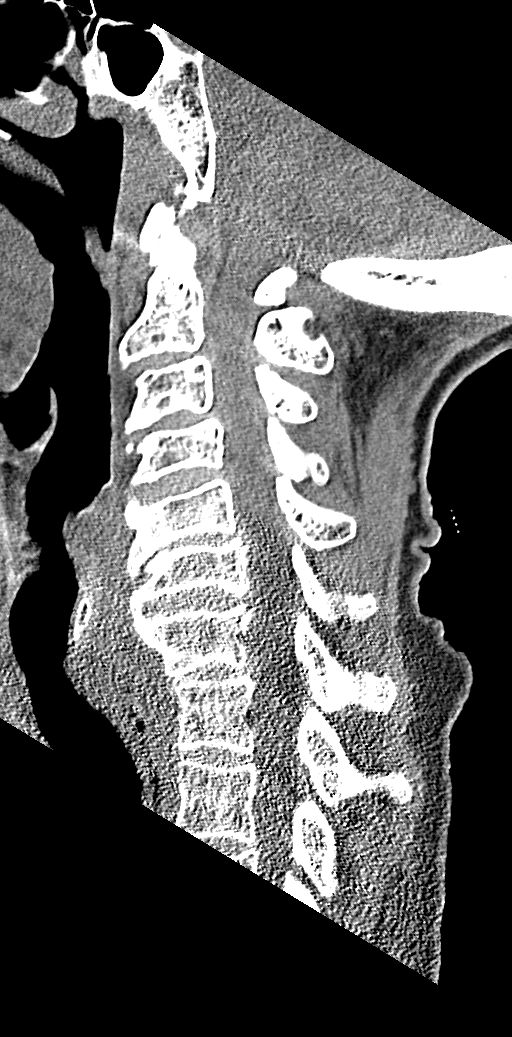
[im 31/61  bone]
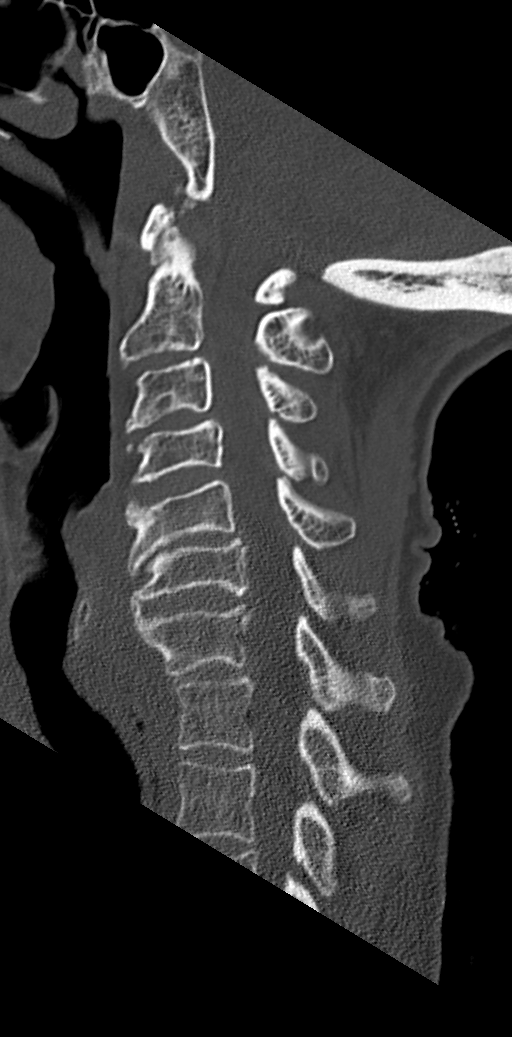
[im 36/61  bone]
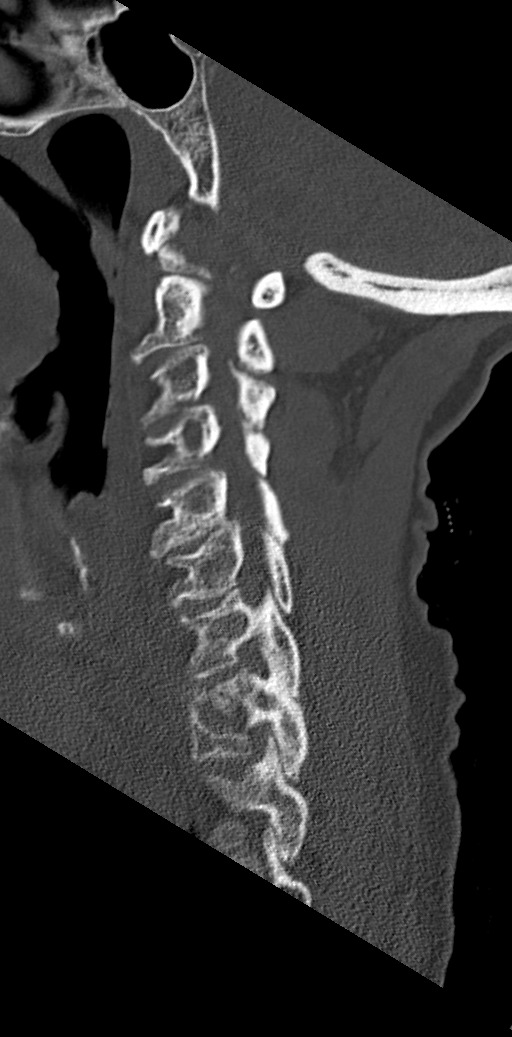
[im 41/61  bone]
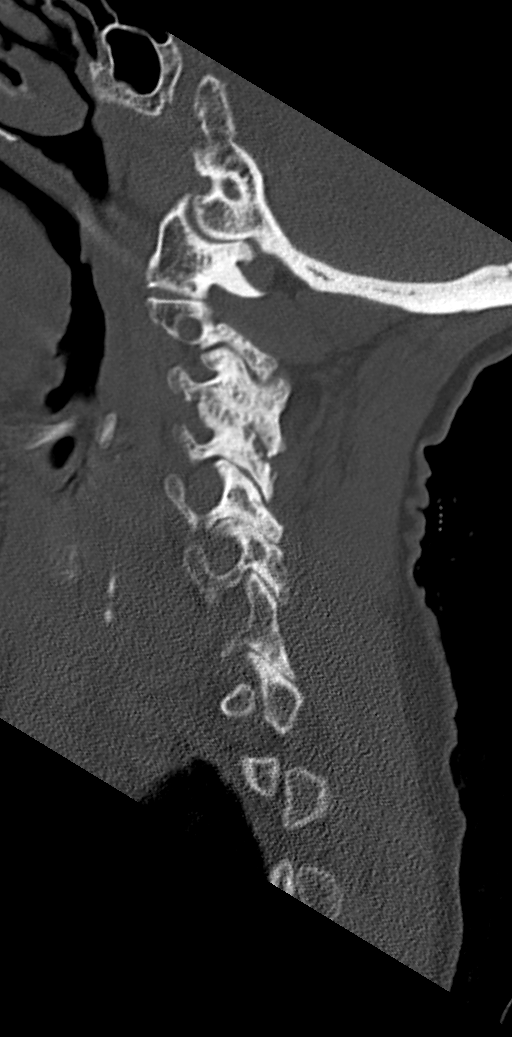

[9 of 33 positions shown; findings below may reference images not displayed]

FINDINGS: Alignment: Overall straightening.  No listhesis.

Skull base and vertebrae: Craniocervical alignment is normal.
Atlantodental interval is not widened. No acute fracture.

Soft tissues and spinal canal: No prevertebral fluid or swelling. No
visible canal hematoma.

Disc levels: Intervertebral disc space narrowing and endplate
remodeling is seen throughout the cervical spine, most severe at
C3-4 and C5-6 in keeping with changes of moderate degenerative disc
disease. The prevertebral soft tissues are not thickened. Review of
the axial images demonstrates multilevel uncovertebral and facet
arthrosis resulting in multilevel mild-to-moderate neuroforaminal
narrowing, most severe on the right at C3-4 and C4-5.

Upper chest: Unremarkable

Other: None
IMPRESSION: No acute fracture or listhesis of the cervical spine.

## 2021-11-07 IMAGING — DX DG ELBOW COMPLETE 3+V*L*
4 series · 4 of 4 positions shown · non-contrast
Comparison: None.

CLINICAL DATA: Fall, left upper extremity injury

EXAM:
LEFT ELBOW - COMPLETE 3+ VIEW

[elbow ap]
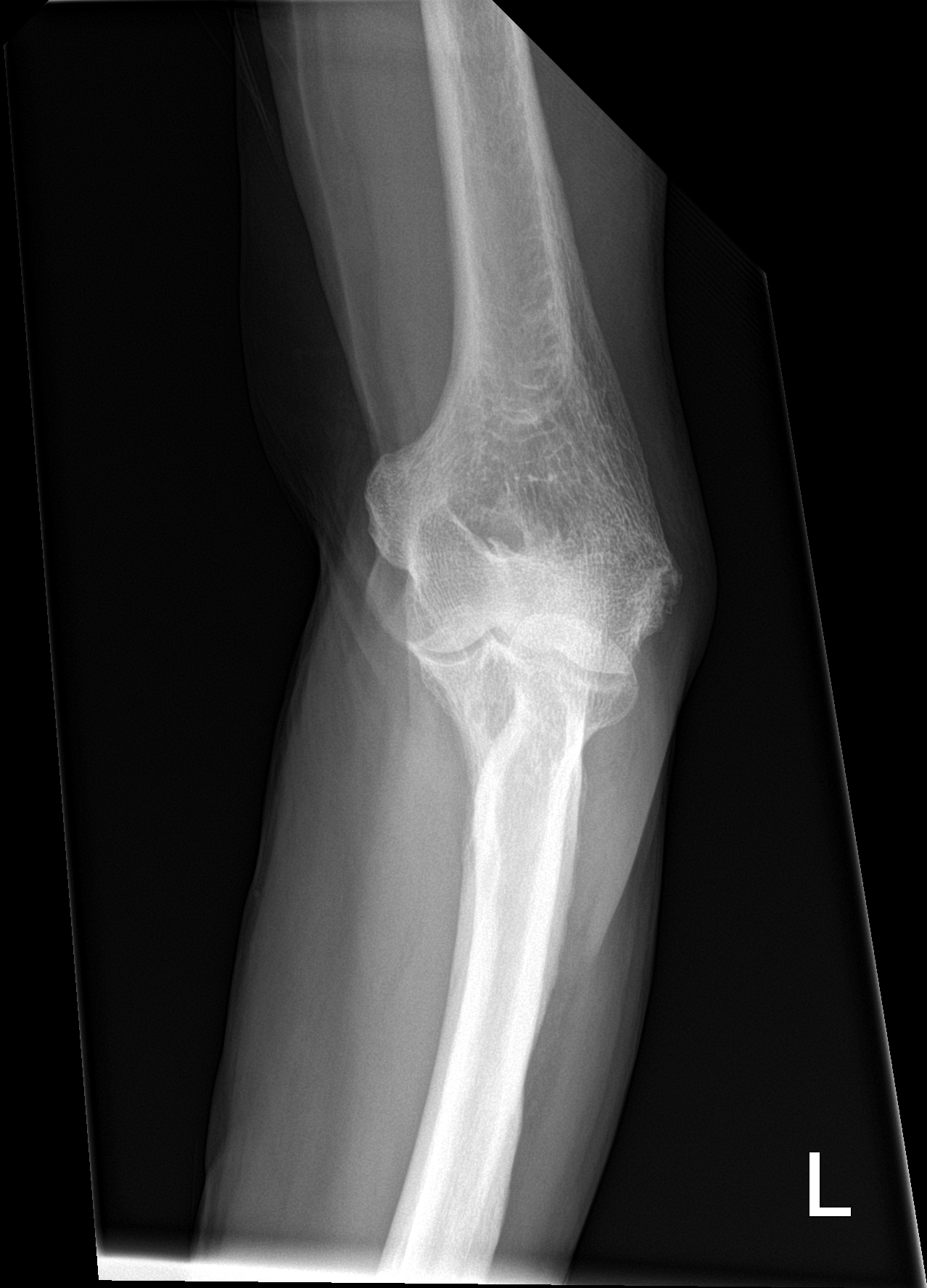

[elbow obl (1 of 2)]
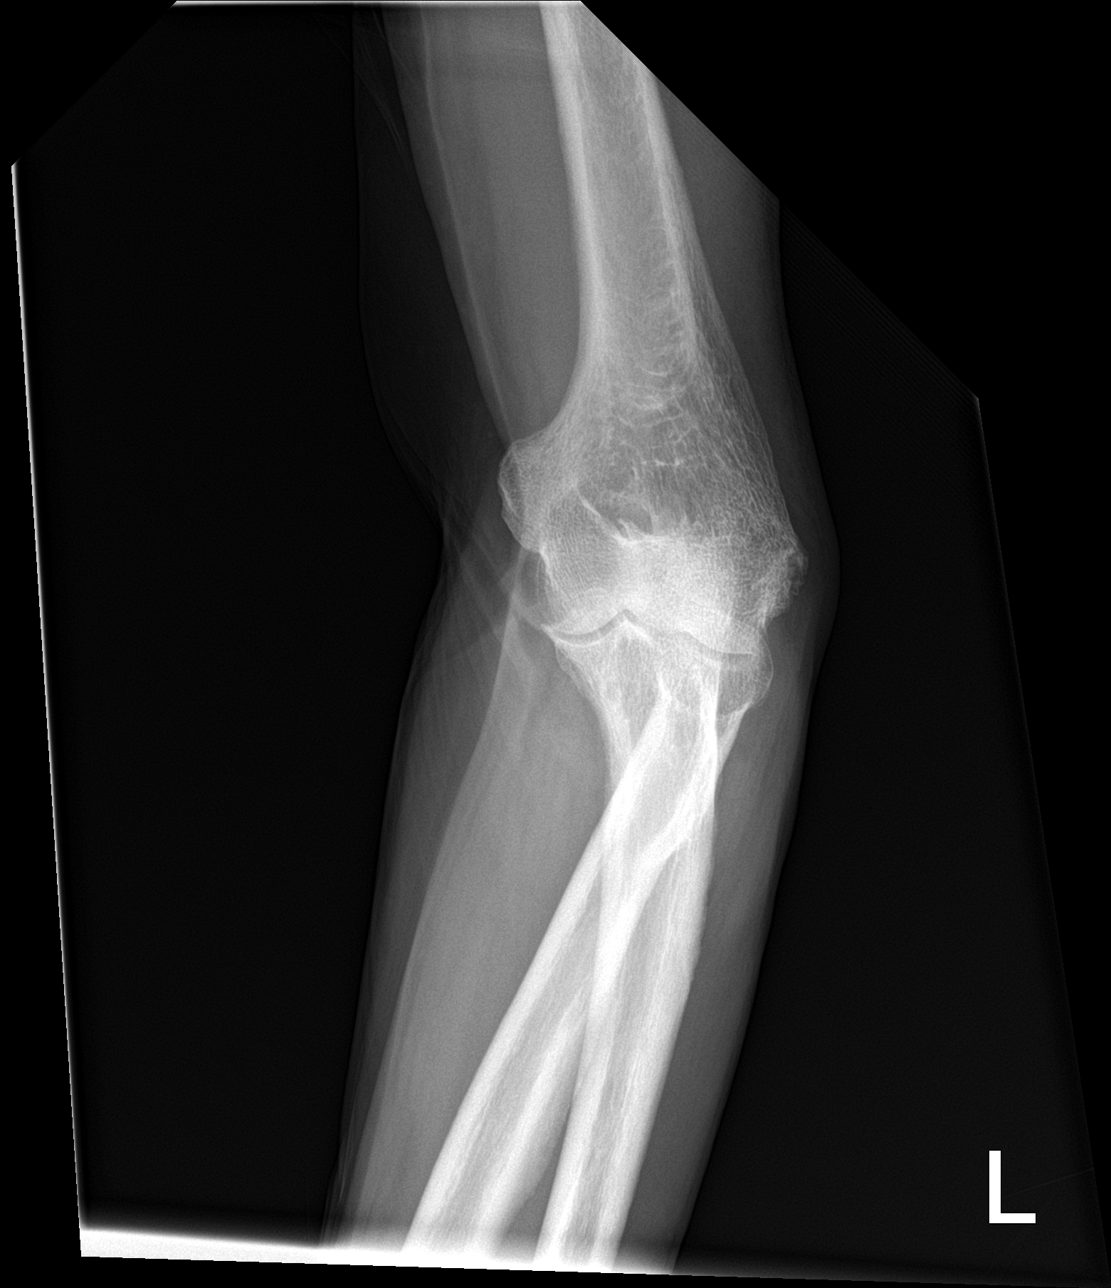

[elbow obl (2 of 2)]
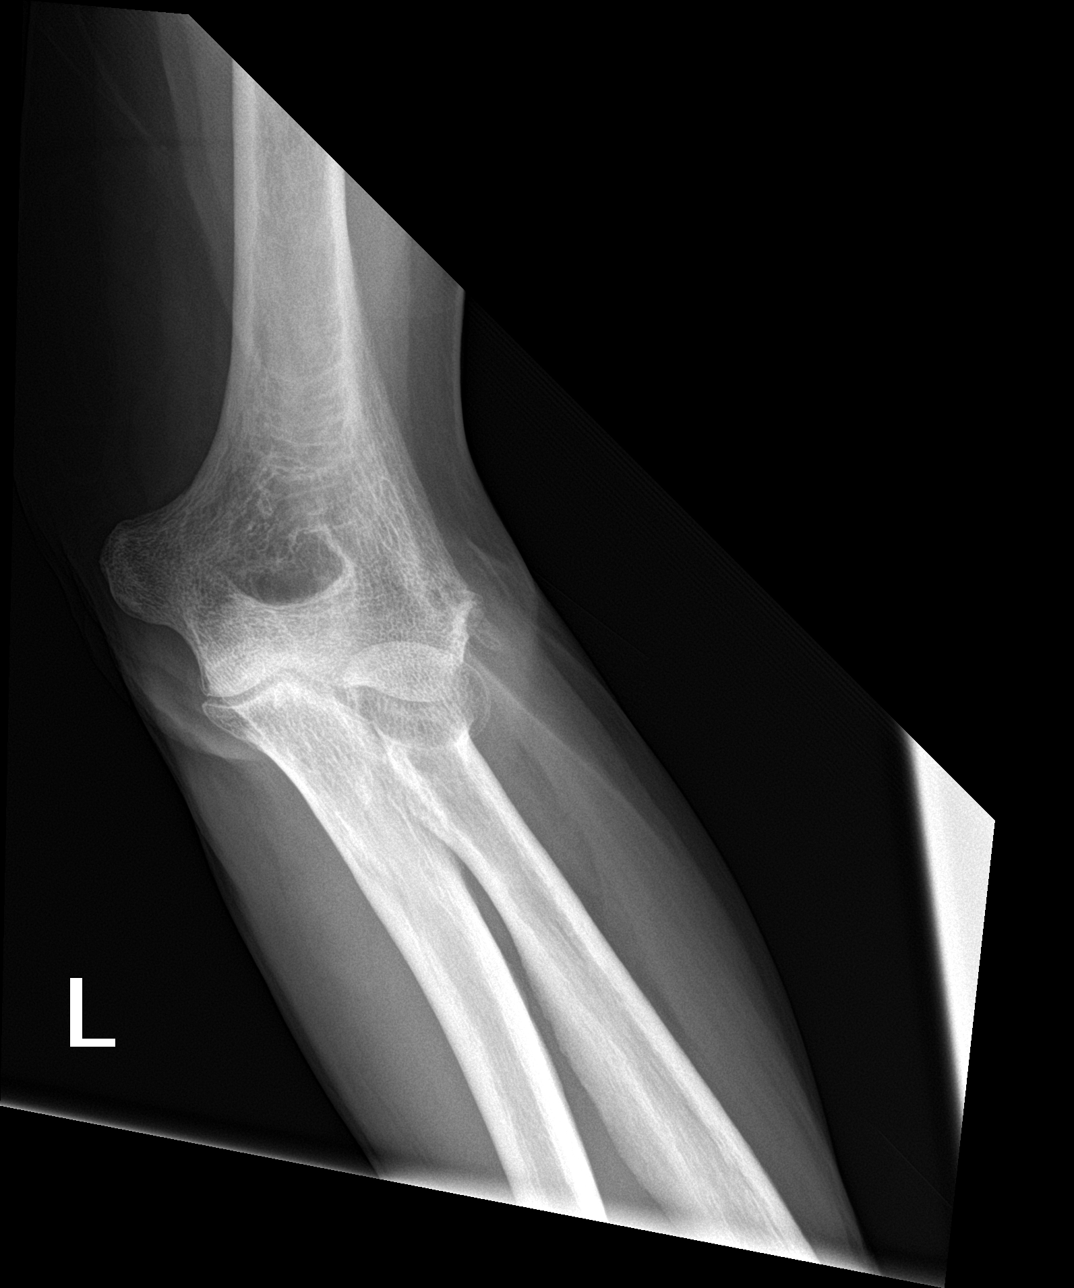

[elbow lat]
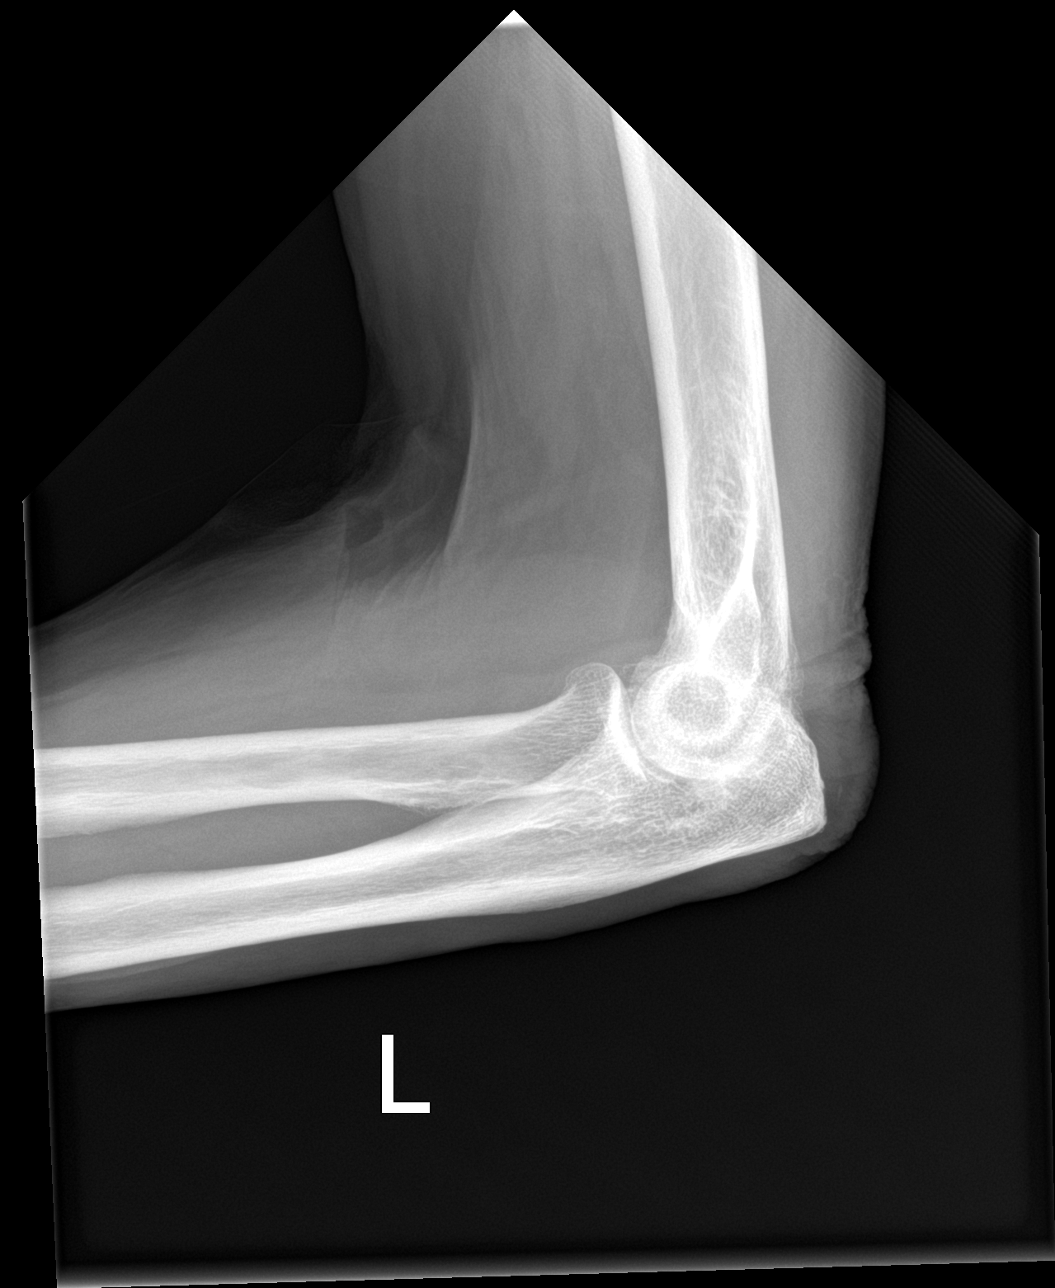

[4 of 4 positions shown; findings below may reference images not displayed]

FINDINGS: Four view radiograph of the left elbow is limited by partial flexion
on all presented images. Normal alignment. No definite fracture or
dislocation identified. Degenerative changes are noted within the
ulnohumeral articulation. No effusion.
IMPRESSION: Limited examination.  No acute fracture or dislocation.

## 2021-11-07 IMAGING — CT CT HEAD W/O CM
3 series · 16 of 47 positions shown, 19 images · non-contrast
Comparison: None

CLINICAL DATA: Confusion and difficulty walking for the last week.

EXAM:
CT HEAD WITHOUT CONTRAST
TECHNIQUE: Contiguous axial images were obtained from the base of the skull
through the vertex without intravenous contrast.

[Series 2: head wo · axial · 0.43mm/px · z∈[+1642,+1768]mm · 10 of 31 slices shown, 13 images]
[im 3/31  brain]
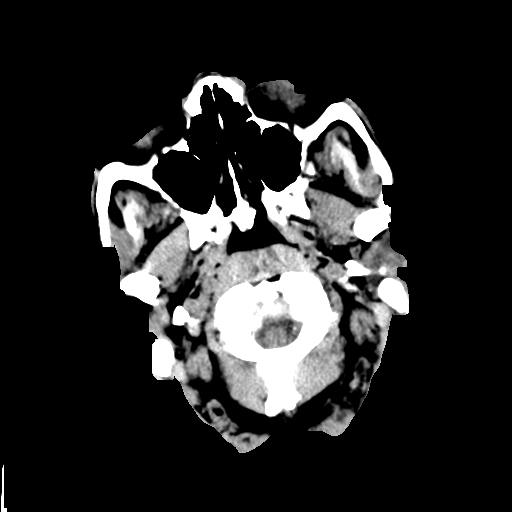
[im 3/31  bone]
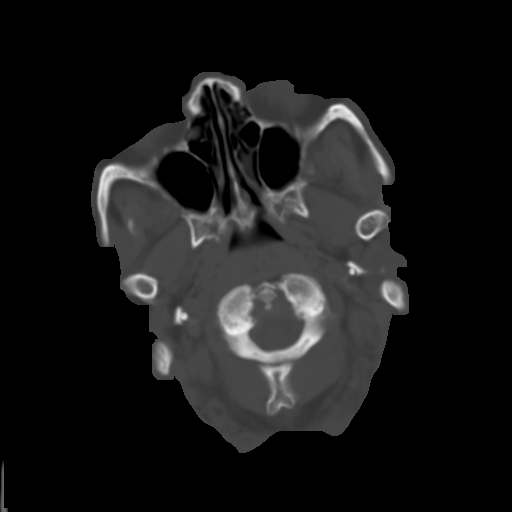
[im 6/31  brain]
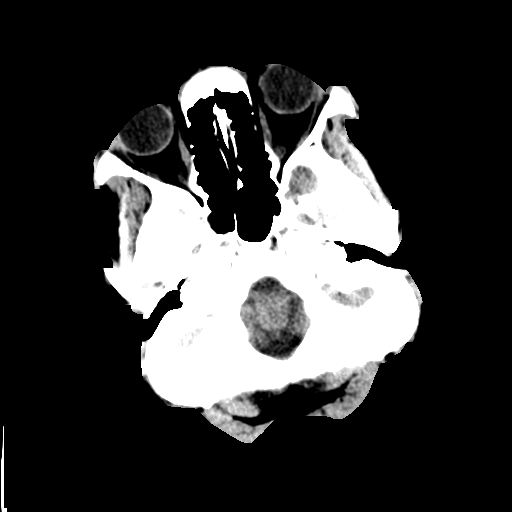
[im 9/31  brain]
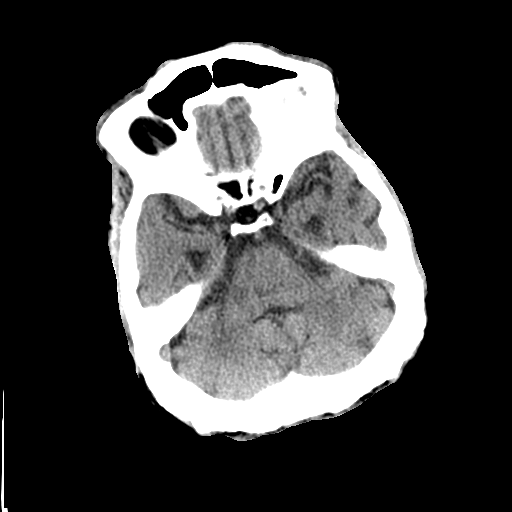
[im 11/31  brain]
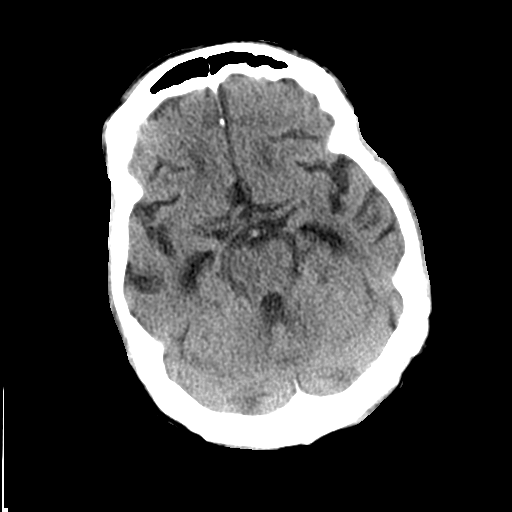
[im 14/31  brain]
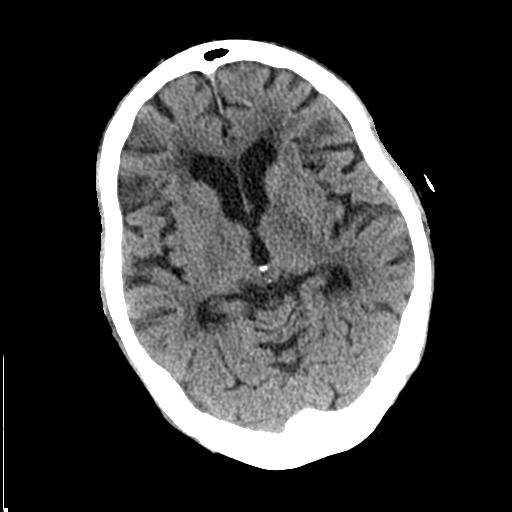
[im 14/31  bone]
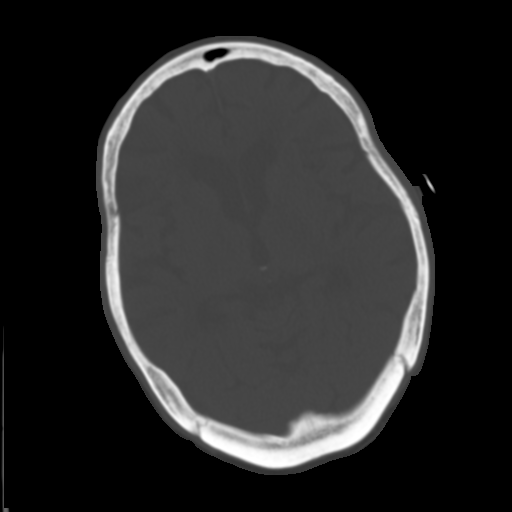
[im 17/31  brain]
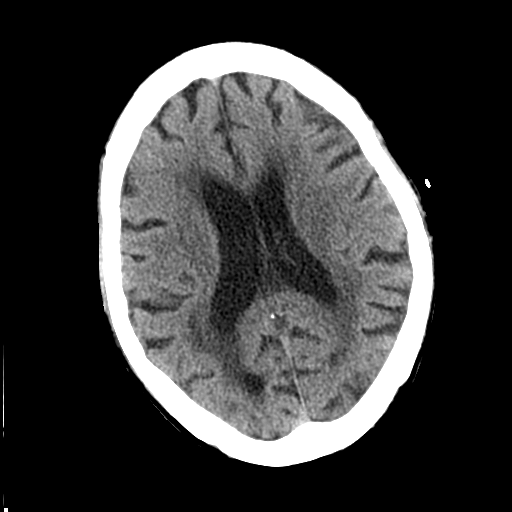
[im 20/31  brain]
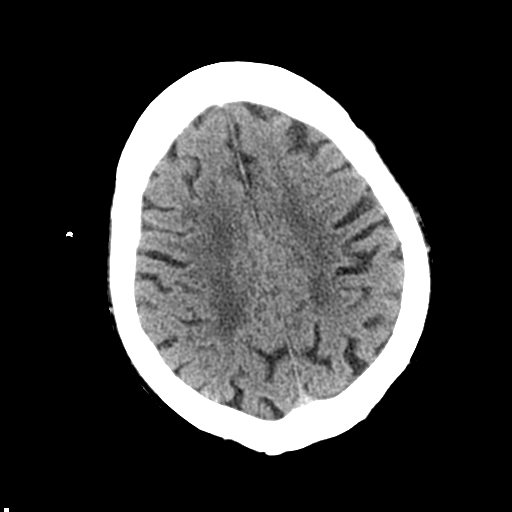
[im 23/31  brain]
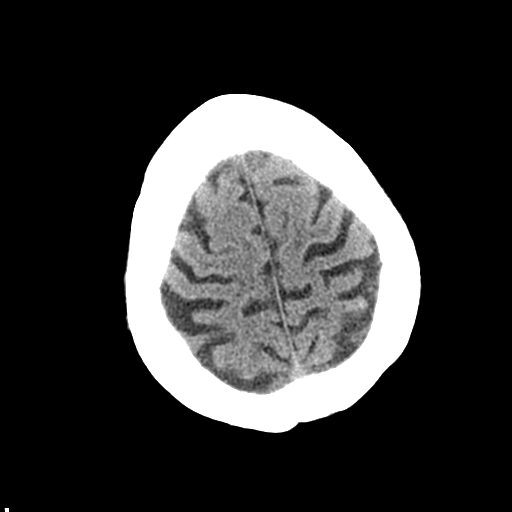
[im 25/31  brain]
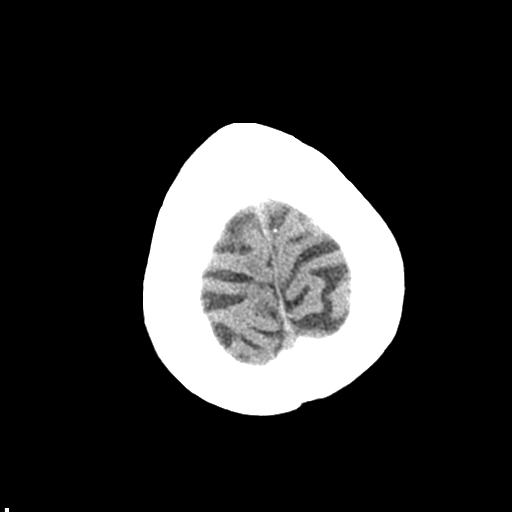
[im 25/31  bone]
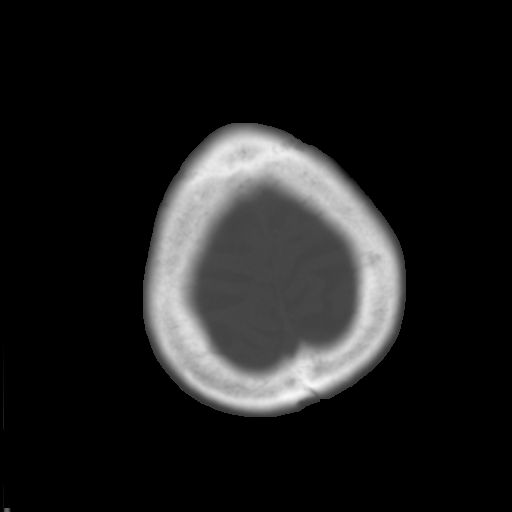
[im 28/31  brain]
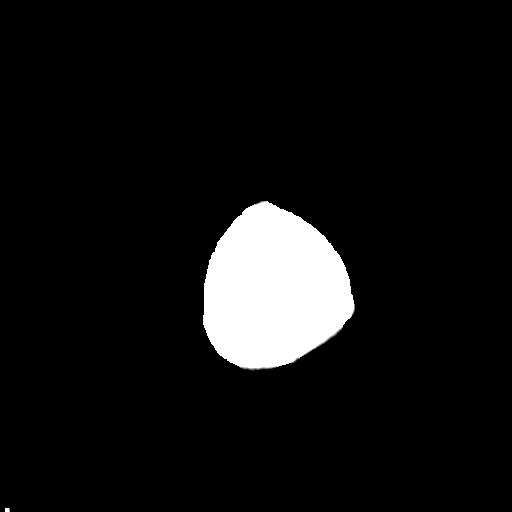

[Series 4: coronal soft tissue · coronal · 0.33mm/px · 3 of 70 slices shown]
[im 24/70  brain]
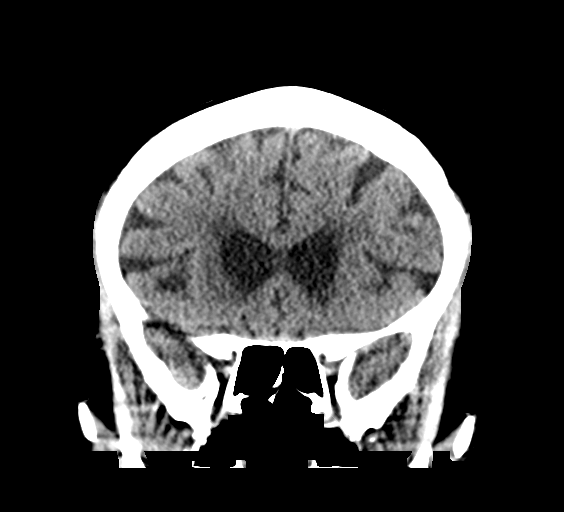
[im 31/70  brain]
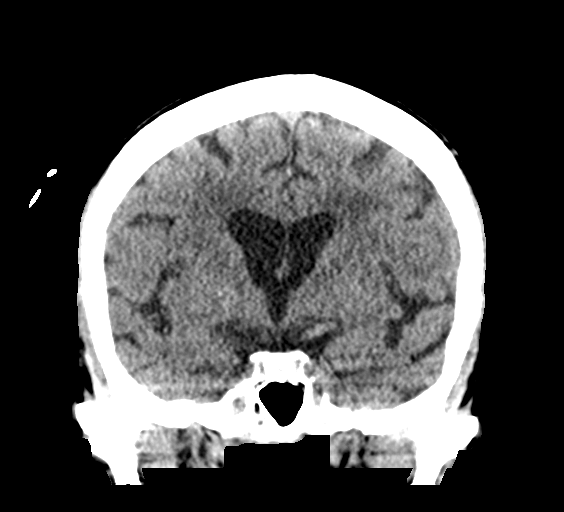
[im 39/70  brain]
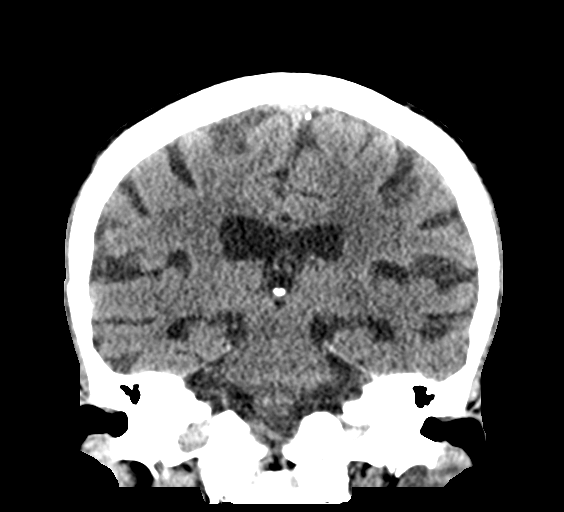

[Series 5: sagittal soft tissue · sagittal · 0.33mm/px · 3 of 62 slices shown]
[im 21/62  brain]
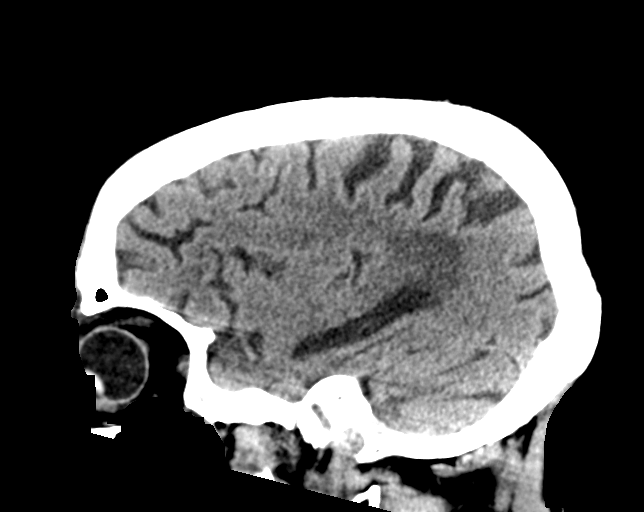
[im 31/62  brain]
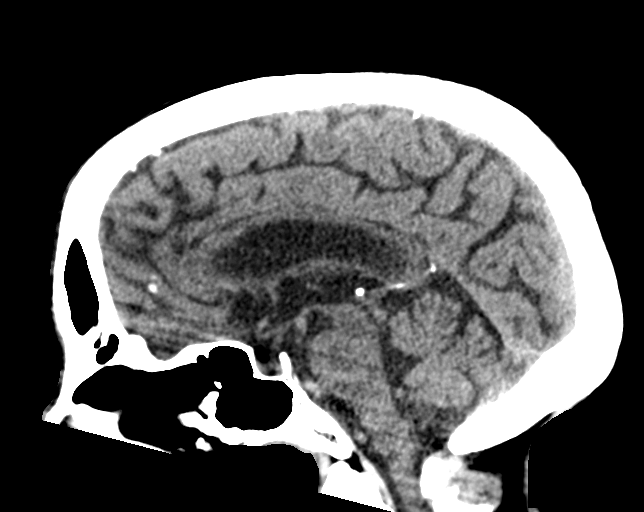
[im 41/62  brain]
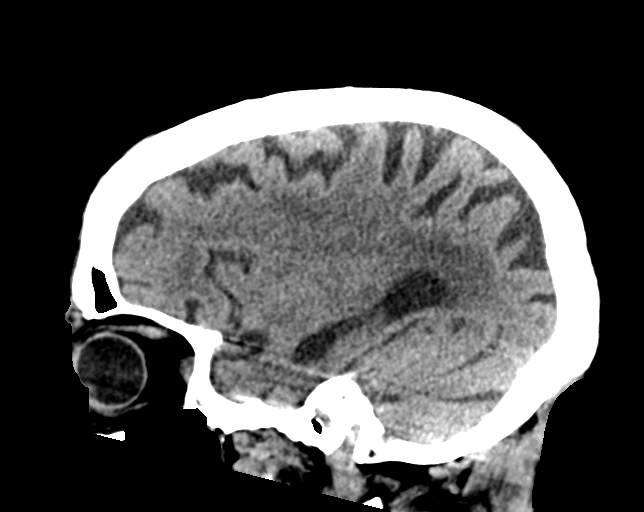

[16 of 47 positions shown; findings below may reference images not displayed]

FINDINGS: Brain: No evidence of acute infarction, hemorrhage, hydrocephalus,
extra-axial collection or mass lesion/mass effect. Signs of atrophy
and of chronic microvascular ischemic change.

Vascular: No hyperdense vessel or unexpected calcification.

Skull: Normal. Negative for fracture or focal lesion.

Sinuses/Orbits: Visualized paranasal sinuses and orbits without
acute process.

Other: None
IMPRESSION: No acute intracranial pathology.

Signs of atrophy and of chronic microvascular ischemic change.

## 2021-11-07 MED ORDER — LORAZEPAM 2 MG/ML IJ SOLN
0.5000 mg | Freq: Once | INTRAMUSCULAR | Status: AC
  Administered 2021-11-07: 0.5 mg via INTRAVENOUS
  Filled 2021-11-07: qty 1

## 2021-11-07 MED ORDER — SODIUM CHLORIDE 0.9% FLUSH
3.0000 mL | Freq: Once | INTRAVENOUS | Status: DC
Start: 2021-11-07 — End: 2021-11-08

## 2021-11-07 NOTE — ED Notes (Signed)
Pt assisted to BR.  Pt requires strong 2 person assist with ambulation.  Pt leans forward and does not hold her own weight.  Pt would fall without strong assistance.

## 2021-11-07 NOTE — Discharge Instructions (Signed)
We had seen Janice Castro in the ER for left-sided weakness, balance issues.  Her MRI of the brain, CT scan of the head, and neck and x-rays are all reassuring.  Blood work is also reassuring.  We had asked you if you would like for her to be placed in a rehab facility, get PT OT evaluation -but you have declined for now.  Please follow-up with the neurologist as soon as possible for further work-up of this weakness, balance issues. Please also connect with your primary care doctor who can send home health, physical therapy and additional support to you if needed.  Please try everything possible to ensure she does not have fall and resultant significant injury.

## 2021-11-07 NOTE — ED Triage Notes (Signed)
Patient brought in by daughter, daughter says patient began falling a lot on Tuesday and has not stopped falling since. Daughter states she took patient to urgent care but patient was not checked for stroke. Patient having difficulty walking also. Daughter reports patient was walking fine and Monday and had a sudden change Tuesday and has not been same since.

## 2021-11-07 NOTE — ED Provider Notes (Addendum)
[facility]  HOSPITAL-EMERGENCY DEPT Provider Note   CSN: [id number] Arrival date & time: 11/07/21  1119     History Chief Complaint  Patient presents with   Janice Castro is a 85 y.o. female.  HPI    85 year old female with history of hypertension, schizophrenia, questionable dementia brought in by her daughter with chief complaint of confusion, weakness and difficulty walking  According to the patient's daughter, couple of days prior to Thanksgiving (about 10 days ago), patient suddenly started having difficulty walking, and they noted that she was holding her left upper extremity.  They have noticed since that patient remains unsteady with her gait and that she likely is weak on the left side.  Patient has had few falls since this incidence. Patient lives by herself, but the family lives nearby and check up on her very frequently and have been helping her with the ADLs.  Prior to the episode, patient was able to function quite independently in her house.  Now she cannot walk around without any assistance.  Daughter is a CNA and has been able to help thus far.  They do not want to place her in the nursing home or rehab facility at this time.  No history of UTI, no recent medications that are new.  Level 5 caveat for dementia -patient is not fully cooperative with history.  Denies having any thing wrong with her, wants to go home.  Past Medical History:  Diagnosis Date   Hypertension    Schizophrenia (HCC)    Tobacco abuse     Patient Active Problem List   Diagnosis Date Noted   Symptomatic anemia 03/24/2018   Hypokalemia 03/24/2018   Schizophrenia (HCC)    Hypertension    Tobacco abuse     History reviewed. No pertinent surgical history.   OB History   No obstetric history on file.     History reviewed. No pertinent family history.  Social History   Tobacco Use   Smoking status: Every Day    Packs/day: 1.00    Types: Cigarettes    Smokeless tobacco: Never  Substance Use Topics   Alcohol use: Not Currently   Drug use: Not Currently    Home Medications Prior to Admission medications   Medication Sig Start Date End Date Taking? Authorizing Provider  amLODipine (NORVASC) 10 MG tablet Take 10 mg by mouth daily.    [provider]  ferrous sulfate 325 (65 FE) MG tablet Take 1 tablet (325 mg total) by mouth 2 (two) times daily with a meal. 03/27/18   Alwyn Ren, MD  haloperidol decanoate (HALDOL DECANOATE) 100 MG/ML injection Inject 75 mg into the muscle every 28 (twenty-eight) days. Last injection 03/08/18    [provider]  nicotine (NICODERM CQ - DOSED IN MG/24 HOURS) 21 mg/24hr patch Place 1 patch (21 mg total) onto the skin daily. 03/28/18   Alwyn Ren, MD  traZODone (DESYREL) 50 MG tablet Take 25-50 mg by mouth at bedtime as needed for sleep.    [provider]  trihexyphenidyl (ARTANE) 2 MG tablet Take 4 mg by mouth daily.    [provider]    Allergies    Patient has no known allergies.  Review of Systems   Review of Systems  Unable to perform ROS: Dementia   Physical Exam Updated Vital Signs BP (!) 146/71   Pulse 61   Temp 98 F (36.7 C) (Oral)   Resp 16  SpO2 99%   Physical Exam Vitals and nursing note reviewed.  Constitutional:      Appearance: She is well-developed.  HENT:     Head: Atraumatic.  Cardiovascular:     Rate and Rhythm: Normal rate.  Pulmonary:     Effort: Pulmonary effort is normal.  Abdominal:     Tenderness: There is no abdominal tenderness.  Musculoskeletal:     Cervical back: Normal range of motion and neck supple.  Skin:    General: Skin is warm and dry.  Neurological:     Mental Status: She is alert. Mental status is at baseline.     Comments: Left elbow is flexed and shoulder is abducted, gross sensory exam appears to be equal for bilateral upper and lower extremity, patient able to bear weight, but having  difficulty ambulating without assistance    ED Results / Procedures / Treatments   Labs (all labs ordered are listed, but only abnormal results are displayed) Labs Reviewed  CBC - Abnormal; Notable for the following components:      Result Value   RBC 3.61 (*)    MCV 101.9 (*)    MCH 34.6 (*)    All other components within normal limits  COMPREHENSIVE METABOLIC PANEL - Abnormal; Notable for the following components:   Potassium 3.2 (*)    AST 70 (*)    All other components within normal limits  I-STAT CHEM 8, ED - Abnormal; Notable for the following components:   Potassium 3.2 (*)    All other components within normal limits  PROTIME-INR  APTT  DIFFERENTIAL  URINALYSIS, ROUTINE W REFLEX MICROSCOPIC  CBG MONITORING, ED    EKG EKG Interpretation  Date/Time:  Saturday November 07 2021 14:13:07 EST Ventricular Rate:  73 PR Interval:  148 QRS Duration: 95 QT Interval:  407 QTC Calculation: 449 R Axis:   134 Text Interpretation: Sinus rhythm Ventricular premature complex Right atrial enlargement Probable anterior infarct, recent Minimal ST elevation, inferior leads Lateral leads are also involved No old tracing to compare Confirmed by Derwood Kaplan [id number]) on 11/07/2021 3:00:32 PM  Radiology DG Elbow Complete Left  Result Date: 11/07/2021 CLINICAL DATA:  Fall, left upper extremity injury EXAM: LEFT ELBOW - COMPLETE 3+ VIEW COMPARISON:  None. FINDINGS: Four view radiograph of the left elbow is limited by partial flexion on all presented images. Normal alignment. No definite fracture or dislocation identified. Degenerative changes are noted within the ulnohumeral articulation. No effusion. IMPRESSION: Limited examination.  No acute fracture or dislocation. Electronically Signed   By: Helyn Numbers M.D.   On: 11/07/2021 22:56   CT HEAD WO CONTRAST  Result Date: 11/07/2021 CLINICAL DATA:  Confusion and difficulty walking for the last week. EXAM: CT HEAD WITHOUT CONTRAST  TECHNIQUE: Contiguous axial images were obtained from the base of the skull through the vertex without intravenous contrast. COMPARISON:  None FINDINGS: Brain: No evidence of acute infarction, hemorrhage, hydrocephalus, extra-axial collection or mass lesion/mass effect. Signs of atrophy and of chronic microvascular ischemic change. Vascular: No hyperdense vessel or unexpected calcification. Skull: Normal. Negative for fracture or focal lesion. Sinuses/Orbits: Visualized paranasal sinuses and orbits without acute process. Other: None IMPRESSION: No acute intracranial pathology. Signs of atrophy and of chronic microvascular ischemic change. Electronically Signed   By: Donzetta Kohut M.D.   On: 11/07/2021 13:43   CT Cervical Spine Wo Contrast  Result Date: 11/07/2021 CLINICAL DATA:  Neck trauma (Age >= 65y).  Multiple falls. EXAM: CT CERVICAL SPINE WITHOUT CONTRAST  TECHNIQUE: Multidetector CT imaging of the cervical spine was performed without intravenous contrast. Multiplanar CT image reconstructions were also generated. COMPARISON:  None. FINDINGS: Alignment: Overall straightening.  No listhesis. Skull base and vertebrae: Craniocervical alignment is normal. Atlantodental interval is not widened. No acute fracture. Soft tissues and spinal canal: No prevertebral fluid or swelling. No visible canal hematoma. Disc levels: Intervertebral disc space narrowing and endplate remodeling is seen throughout the cervical spine, most severe at C3-4 and C5-6 in keeping with changes of moderate degenerative disc disease. The prevertebral soft tissues are not thickened. Review of the axial images demonstrates multilevel uncovertebral and facet arthrosis resulting in multilevel mild-to-moderate neuroforaminal narrowing, most severe on the right at C3-4 and C4-5. Upper chest: Unremarkable Other: None IMPRESSION: No acute fracture or listhesis of the cervical spine. Electronically Signed   By: Helyn Numbers M.D.   On: 11/07/2021  22:34   MR BRAIN WO CONTRAST  Result Date: 11/07/2021 CLINICAL DATA:  Neuro deficit, acute, stroke suspected EXAM: MRI HEAD WITHOUT CONTRAST TECHNIQUE: Multiplanar, multiecho pulse sequences of the brain and surrounding structures were obtained without intravenous contrast. COMPARISON:  None. FINDINGS: Motion artifact is present. Brain: There is no acute infarction or intracranial hemorrhage. There is no intracranial mass, mass effect, or edema. There is no hydrocephalus or extra-axial fluid collection. Prominence of the ventricles and sulci reflects parenchymal volume loss. Patchy and confluent T2 hyperintensity in the supratentorial and pontine white matter is nonspecific but probably reflects moderate chronic microvascular ischemic changes. Vascular: Major vessel flow voids at the skull base are preserved. Skull and upper cervical spine: Normal marrow signal is preserved. Sinuses/Orbits: Paranasal sinuses are aerated. Orbits are unremarkable. Other: Sella is unremarkable.  Mastoid air cells are clear. IMPRESSION: No acute infarction, hemorrhage, or mass. Moderate chronic microvascular ischemic changes. Electronically Signed   By: Guadlupe Spanish M.D.   On: 11/07/2021 18:21   DG Pelvis Portable  Result Date: 11/07/2021 CLINICAL DATA:  Fall, left upper extremity injury EXAM: PORTABLE PELVIS 1-2 VIEWS COMPARISON:  None. FINDINGS: Normal alignment. No acute fracture or dislocation. A a 19 mm needle-like linear metallic foreign body is seen overlying the left inferior trochanter, possibly representing an object overlying the patient or retained radiopaque foreign body. Vascular calcifications are noted. IMPRESSION: No acute fracture or dislocation. Possible 19 mm needle-like foreign body within the proximal left thigh. Electronically Signed   By: Helyn Numbers M.D.   On: 11/07/2021 22:52   DG Chest Port 1 View  Result Date: 11/07/2021 CLINICAL DATA:  Fall, chest injury EXAM: PORTABLE CHEST 1 VIEW  COMPARISON:  None. FINDINGS: Lungs are well expanded, symmetric, and clear. No pneumothorax or pleural effusion. Cardiac size within normal limits. Pulmonary vascularity is normal. Osseous structures are age-appropriate. No acute bone abnormality. IMPRESSION: No active disease. Electronically Signed   By: Helyn Numbers M.D.   On: 11/07/2021 22:53    Procedures Procedures   Medications Ordered in ED Medications  sodium chloride flush (NS) 0.9 % injection 3 mL (3 mLs Intravenous Not Given 11/07/21 1558)  LORazepam (ATIVAN) injection 0.5 mg (0.5 mg Intravenous Given 11/07/21 1715)    ED Course  I have reviewed the triage vital signs and the nursing notes.  Pertinent labs & imaging results that were available during my care of the patient were reviewed by me and considered in my medical decision making (see chart for details).    MDM Rules/Calculators/A&P  85 year old female comes in with chief complaint of confusion, ataxia, left upper extremity weakness.  Symptoms were sudden onset.  No history of UTI, no new medications.  Patient does not have any abdominal tenderness.  She is alert but not cooperative likely due to her dementia.  There is no gross evidence of significant trauma.  She had CT scan of the brain, which is negative for any acute bleed.  She has had some falls recently.  Concerns are high for stroke.  MRI brain ordered.  Basic lab work-up also ordered.  Patient's basic blood work-up is reassuring.  MRI brain did not reveal any subacute or acute infarcts.  Symptoms have been present now for at least 10 days.  Discussed with the family member if they would want patient to get placed to the rehab facility, in which case we would keep her overnight and get social worker, PT-OT evaluation.  Daughter is a CNA and has enough support that she is willing to take her home for now.  She also knows how to get ancillary services on board if needed through the  PCP.  I have added CT cervical spine in the setting of MRI being normal and chest x-ray plus upper extremity x-rays to ensure there is no traumatic injury that we are missing.  Patient will also need neurology outpatient follow-up.  11:52 PM Results discussed with the family.  They still are okay with her going home and getting outpatient neuro work-up and PCP evaluation for home health/PT etc.  Strict ER return precautions have been discussed, and patient is agreeing with the plan and is comfortable with the workup done and the recommendations from the ER.   Final Clinical Impression(s) / ED Diagnoses Final diagnoses:  Ataxia  Left-sided weakness    Rx / DC Orders ED Discharge Orders          Ordered    Ambulatory referral to Neurology       Comments: An appointment is requested in approximately: 1 week Ataxia with left-sided weakness, MRI brain is normal   11/07/21 2223              Derwood Kaplan, MD 11/07/21 2353

## 2021-11-07 NOTE — ED Provider Notes (Signed)
Emergency Medicine Provider Triage Evaluation Note  Janice Castro , a 86 y.o. female  was evaluated in triage.  Pt complains of confusion, unilateral weakness, difficulty walking for the last week. Symptoms started suddenly, no prior confusion or strength deficits. Told it was arthritis by urgent care. Hx of smoking, htn.  Review of Systems  Positive: Confusion, dysarthria, weakness Negative: Chest pain, shortness of breaht  Physical Exam  BP (!) 147/73 (BP Location: Left Arm)   Pulse 70   Temp 98 F (36.7 C) (Oral)   Resp 16   SpO2 100%  Gen:   Awake, no distress   Resp:  Normal effort  MSK:  Unilateral weakness on left compared to right, strength 4/5 Other:   Cranial nerves 3-12 grossly intact, questionable dysarthria, questionable droop on left  Medical Decision Making  Medically screening exam initiated at 12:12 PM.  Appropriate orders placed.  Janice Castro was informed that the remainder of the evaluation will be completed by another provider, this initial triage assessment does not replace that evaluation, and the importance of remaining in the ED until their evaluation is complete  Stroke symptoms outside of window -- labs ordered without activating code stroke   Janice Castro 11/07/21 1215    Janice Mulders, MD 11/12/21 [phone]

## 2021-11-07 NOTE — ED Notes (Signed)
Pt to MRI

## 2021-11-08 NOTE — ED Notes (Signed)
Patient discharged. AVS reviewed no question at this time.

## 2021-11-13 ENCOUNTER — Encounter: Payer: Self-pay | Admitting: Physician Assistant

## 2021-11-13 ENCOUNTER — Ambulatory Visit (INDEPENDENT_AMBULATORY_CARE_PROVIDER_SITE_OTHER): Payer: Medicare Other | Admitting: Physician Assistant

## 2021-11-13 ENCOUNTER — Other Ambulatory Visit: Payer: Self-pay

## 2021-11-13 VITALS — BP 146/68 | HR 88 | Ht 64.0 in | Wt 105.0 lb

## 2021-11-13 DIAGNOSIS — E538 Deficiency of other specified B group vitamins: Secondary | ICD-10-CM

## 2021-11-13 DIAGNOSIS — R270 Ataxia, unspecified: Secondary | ICD-10-CM

## 2021-11-13 DIAGNOSIS — R2990 Unspecified symptoms and signs involving the nervous system: Secondary | ICD-10-CM | POA: Diagnosis not present

## 2021-11-13 DIAGNOSIS — R531 Weakness: Secondary | ICD-10-CM | POA: Diagnosis not present

## 2021-11-13 NOTE — Progress Notes (Signed)
Assessment/Plan:   Janice Castro is a very pleasant 85 y.o. year old RH female with risk factors including  age, hypertension, hyperlipidemia, schizophrenia, heavy tobacco abuse, documented dementia without formal neurological evaluation (patient denies),  and  seen today for evaluation of possible ataxia  MRI brain is negative for stroke or other acute intracranial abnormalities.  Left elbow is without fracture or dislocation.  However, the patient is at risk for stroke, and other cardiovascular risk factors.  EKG shows sinus rhythm, RAE, and possible recent myocardial infarction.   Recommendations:   L arm contraction, ataxia of unclear etiology.  Patient is able to move both arms, but upon extending the left arm below the elbow, there is significant difficulty.  There are no sensory deficiencies . Upon walking, she is appearing to be very weak in the condition, but there is no ataxic ambulation noted.   Recommend Check B12, TSH, Vit E , lipid panel, A1C, anemia panel by PCP Discussed safety both in and out of the home., recommend PT and OT for strength and balance   MRA angiogram head to evaluate vascular load  Obtain 2 D Echo  Continue Aspirin and high-dose statin, antihypertensives    Smoking cessation Follow up pending on the results  In the interim, referral to cardiology due to abnormal EKG, with possible recent infarction.  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  The patient is accompanied by her daughter, who is a CNA, and is able to supplement the history.  According to her, she was "normal until 2 weeks ago ", when she became confused, and began to call her daughter "the sister ".  That  was on 10/28/2021, then her daughter took her to urgent care, at which time imaging was performed, showing "bad arthritis and scoliosis, and the pain in her legs".  On 1128, she saw her PCP, who sent her to [facility] on 11/07/2021, as she kept falling due to debilitation.  At the hospital, an MRI was performed of the brain, which was negative for stroke.  Daughter denies any changes in the patient's medications.  In today's visit, the patient is able to move her legs, perhaps with a decrease of strength bilaterally but is able to have sensation in both lower extremities and upper extremities.  Upon discharge from the hospital, PT was recommended, but her daughter is to take care of her as she is a Chief Strategy Officer and did not want to place her on a nursing home or rehab.  Denies any falls.  No slurred speech.  She has been taking for many years the current psychiatric medications, so daughter is sure that these meds did not cause these changes. Patient  never had a similar episode. Denies any history of TIA. Denies vertigo dizziness or vision changes. Denies headaches, dysarthria or dysphagia. No confusion or seizures. Denies any chest pain, or shortness of breath. Denies any fever or chills, or night sweats.  She smokes 2 pack a day of cigarettes for many years.  No hormonal supplements. Does take a regular ASA a day, with no other antiplatelets or anticoagulants.Denies any recent long distance trips or recent surgeries. No sick contacts. No new stressors present in  personal life Patient is compliant with his medications. .Possible  family history of stroke she denies alcohol history.  She denies a history of multiple sclerosis.  Of note, MMSE today was incomplete, but scored to the best of the abilities at 8 of 30 suggestive of dementia.  Patient and daughter not interested at this time to pursuing any dementia work-up, and they wish to rule out a stroke.  Denies recent UTI.     No Known  Allergies  Current Outpatient Medications  Medication Instructions   amLODipine (NORVASC) 10 mg, Oral, Daily   ferrous sulfate 325 mg, Oral, 2 times daily with meals   haloperidol decanoate (HALDOL DECANOATE) 75 mg, Intramuscular, Every 28 days, Last injection 03/08/18   nicotine (NICODERM CQ - DOSED IN MG/24 HOURS) 21 mg, Transdermal, Daily   simvastatin (ZOCOR) 5 mg, Oral, Daily   traZODone (DESYREL) 25-50 mg, At bedtime PRN   trihexyphenidyl (ARTANE) 4 mg, Oral, Daily     VITALS:   Vitals:   11/13/21 1007  BP: (!) 146/68  Pulse: 88  Weight: 105 lb (47.6 kg)  Height: 5\' 4"  (1.626 m)   No flowsheet data found.  PHYSICAL EXAM   HEENT:  Normocephalic, atraumatic. The mucous membranes are moist. The superficial temporal arteries are without ropiness or tenderness. Cardiovascular: Regular rate and rhythm. Lungs: Clear to auscultation bilaterally. Neck: There are no carotid bruits noted bilaterally.  NEUROLOGICAL: No flowsheet data found. MMSE - Mini Mental State Exam 11/13/2021  Orientation to time 1  Orientation to Place 0  Registration 3  Attention/ Calculation 0  Recall 0  Language- name 2 objects 2  Language- repeat 0  Language- follow 3 step command 1  Language- read & follow direction 1  Write a sentence 0  Copy design 0  Total score 8    No flowsheet data found.   Orientation:  Alert and oriented to person, not to place or time No aphasia or dysarthria. Fund of knowledge is reduced.. Recent and remote memory impaired. Attention and concentration arereducedl.  Unable to name objects and repeat phrases. Delayed recall  0/3 Cranial nerves: There is good facial symmetry. Extraocular muscles are intact and visual fields are full to confrontational testing. Speech is not fluent or clear, it appears to be her baseline.   Soft palate rises symmetrically and there is no tongue deviation. Hearing is intact to conversational tone. Tone: Tone is good throughout. Sensation:  Sensation is intact to light touch and pinprick throughout. Vibration is intact at the bilateral big toe.There is no extinction with double simultaneous stimulation. There is no sensory dermatomal level identified. Coordination: The patient has no difficulty with RAM's or FNF bilaterally. Normal finger to nose  Motor: Strength is 4/5 in the bilateral upper and lower extremities. There is no pronator drift. There are no fasciculations noted.  Patient is unable to extend fully the left arm, with significant resistance. DTR's: Deep tendon reflexes are 2/4 at the bilateral biceps, triceps, brachioradialis, patella and achilles.  Plantar responses are downgoing bilaterally.  No Hoffmann sign. Gait and Station: The patient is unable to ambulate without difficulty.however no ataxia is noted, but visible very small steps, with some degree of deconditioning, she is able to leave her legs.  She is unable to stand on her own, needs assistance.  Thank you for allowing 14/08/2021 the opportunity to participate in the care of this nice patient. Please do not hesitate to contact us for any questions or concerns.   Total time  spent on today's visit was 60 minutes, including both face-to-face time and nonface-to-face time.  Time included that spent on review of records (prior notes available to me/labs/imaging if pertinent), discussing treatment and goals, answering patient's questions and coordinating care.  Cc:  Rometta Emery, MD  Marlowe Kays 11/13/2021 1:57 PM

## 2021-11-13 NOTE — Patient Instructions (Addendum)
  Recommend PT/OT for strength and balance 2D echo to rule out structural abnormalities MRA of the head and neck  Cardiology referral  Continue Aspirin and cholesterol and high blood pressure medicine  Discontinue smoking Follow up pending on the labs . Will send information to Dr. Mikeal Hawthorne

## 2021-12-11 ENCOUNTER — Other Ambulatory Visit: Payer: Self-pay

## 2021-12-11 ENCOUNTER — Encounter: Payer: Self-pay | Admitting: Cardiology

## 2021-12-11 ENCOUNTER — Ambulatory Visit (INDEPENDENT_AMBULATORY_CARE_PROVIDER_SITE_OTHER): Payer: Medicare Other | Admitting: Cardiology

## 2021-12-11 VITALS — BP 90/74 | HR 74 | Ht 64.0 in | Wt 104.2 lb

## 2021-12-11 DIAGNOSIS — I10 Essential (primary) hypertension: Secondary | ICD-10-CM

## 2021-12-11 DIAGNOSIS — G459 Transient cerebral ischemic attack, unspecified: Secondary | ICD-10-CM | POA: Diagnosis not present

## 2021-12-11 DIAGNOSIS — E538 Deficiency of other specified B group vitamins: Secondary | ICD-10-CM | POA: Diagnosis not present

## 2021-12-11 DIAGNOSIS — Z720 Tobacco use: Secondary | ICD-10-CM | POA: Diagnosis not present

## 2021-12-11 NOTE — Progress Notes (Signed)
Cardiology Office Note:    Date:  12/11/2021   ID:  Janice Castro, DOB Aug 13, 1936, MRN [id number]  PCP:  Rometta EmeryGarba, Mohammad L, MD  Cardiologist:  Thomasene RippleKardie Kara Melching, DO  Electrophysiologist:  None   Referring MD: Marcos EkeWertman, Sara E, PA-C   " Unable to get a chief complaint from the patient"  History of Present Illness:    Janice Castro is a 86 y.o. female with a hx of hypertension, schizophrenia, tobacco use here with her daughter.  Patient is a very poor historian.  Per daughter she does also have Alzheimer's dementia.  Per the patient daughter on December 3 she experienced abrupt ataxia difficulty walking and moving her limbs she was therefore [facility] where she underwent extensive work-up including MRI and CT of the head which were all within normal limits.  She was asked to see neurology and she has seen neurology recently with for further cardiology giving her abnormal EKG as well as need for echocardiogram.  She adamantly denies any chest pain or shortness of breath.  Her daughter also tells me at home she is not complaining about these.  Past Medical History:  Diagnosis Date   Arthritis    Hypertension    Schizophrenia (HCC)    Tobacco abuse     No past surgical history on file.  Current Medications: Current Meds  Medication Sig   amLODipine (NORVASC) 10 MG tablet Take 10 mg by mouth daily.   aspirin EC 81 MG tablet Take 81 mg by mouth daily. Swallow whole.   docusate sodium (COLACE) 50 MG capsule Take 50 mg by mouth every other day.   haloperidol decanoate (HALDOL DECANOATE) 100 MG/ML injection Inject 75 mg into the muscle every 28 (twenty-eight) days. Last injection 03/08/18   simvastatin (ZOCOR) 5 MG tablet Take 5 mg by mouth daily.   traZODone (DESYREL) 50 MG tablet Take 25-50 mg by mouth at bedtime as needed for sleep.   trihexyphenidyl (ARTANE) 2 MG tablet Take 4 mg by mouth daily.     Allergies:   Patient has no known allergies.    Social History   Socioeconomic History   Marital status: Married    Spouse name: Not on file   Number of children: Not on file   Years of education: Not on file   Highest education level: Not on file  Occupational History   Not on file  Tobacco Use   Smoking status: Every Day    Packs/day: 1.00    Types: Cigarettes   Smokeless tobacco: Never  Substance and Sexual Activity   Alcohol use: Not Currently   Drug use: Not Currently   Sexual activity: Not on file  Other Topics Concern   Not on file  Social History Narrative   Not on file   Social Determinants of Health   Financial Resource Strain: Not on file  Food Insecurity: Not on file  Transportation Needs: Not on file  Physical Activity: Not on file  Stress: Not on file  Social Connections: Not on file     Family History: The patient's family history includes Migraines in an other family member; Schizophrenia in her daughter, mother, son, and son.  ROS:   Review of Systems  Constitution: Negative for decreased appetite, fever and weight gain.  HENT: Negative for congestion, ear discharge, hoarse voice and sore throat.   Eyes: Negative for discharge, redness, vision loss in right eye and visual halos.  Cardiovascular: Negative for  chest pain, dyspnea on exertion, leg swelling, orthopnea and palpitations.  Respiratory: Negative for cough, hemoptysis, shortness of breath and snoring.   Endocrine: Negative for heat intolerance and polyphagia.  Hematologic/Lymphatic: Negative for bleeding problem. Does not bruise/bleed easily.  Skin: Negative for flushing, nail changes, rash and suspicious lesions.  Musculoskeletal: Negative for arthritis, joint pain, muscle cramps, myalgias, neck pain and stiffness.  Gastrointestinal: Negative for abdominal pain, bowel incontinence, diarrhea and excessive appetite.  Genitourinary: Negative for decreased libido, genital sores and incomplete emptying.  Neurological: Negative for brief  paralysis, focal weakness, headaches and loss of balance.  Psychiatric/Behavioral: Negative for altered mental status, depression and suicidal ideas.  Allergic/Immunologic: Negative for HIV exposure and persistent infections.    EKGs/Labs/Other Studies Reviewed:    The following studies were reviewed today:   EKG:  The ekg ordered today demonstrates   Recent Labs: 11/07/2021: ALT 31; Hemoglobin 12.9; Platelets 279 12/11/2021: BUN 14; Creatinine, Ser 0.72; Magnesium 2.2; Potassium 4.5; Sodium 143  Recent Lipid Panel No results found for: CHOL, TRIG, HDL, CHOLHDL, VLDL, LDLCALC, LDLDIRECT  Physical Exam:    VS:  BP 90/74    Pulse 74    Ht 5\' 4"  (1.626 m)    Wt 104 lb 3.2 oz (47.3 kg)    BMI 17.89 kg/m     Wt Readings from Last 3 Encounters:  12/11/21 104 lb 3.2 oz (47.3 kg)  11/13/21 105 lb (47.6 kg)  03/24/18 104 lb 8 oz (47.4 kg)     GEN: Well nourished, well developed in no acute distress HEENT: Normal NECK: No JVD; No carotid bruits LYMPHATICS: No lymphadenopathy CARDIAC: S1S2 noted,RRR, no murmurs, rubs, gallops RESPIRATORY:  Clear to auscultation without rales, wheezing or rhonchi  ABDOMEN: Soft, non-tender, non-distended, +bowel sounds, no guarding. EXTREMITIES: No edema, No cyanosis, no clubbing MUSCULOSKELETAL:  No deformity  SKIN: Warm and dry NEUROLOGIC:  Alert and oriented x 3, non-focal PSYCHIATRIC:  Normal affect, good insight  ASSESSMENT:    1. TIA (transient ischemic attack)   2. Vitamin B12 deficiency   3. Tobacco abuse   4. Primary hypertension    PLAN:     She has not experiencing any anginal symptoms.  We elected to start with an echocardiogram to assess her for any LV dysfunction or structural abnormalities.  This will help 03/26/18 see if there is any wall motion abnormalities. Up to now it is not quite clear if this was a TIA she is being worked up by neurology as well.  It would be very nice to place a monitor on the patient but the patient daughter  does not think she will be able to wear an ambulatory monitor and think she would take it off.  Continue aspirin and statin for now.  Blood pressure is acceptable, continue with current antihypertensive regimen.  The patient is in agreement with the above plan. The patient left the office in stable condition.  The patient will follow up in   Medication Adjustments/Labs and Tests Ordered: Current medicines are reviewed at length with the patient today.  Concerns regarding medicines are outlined above.  Orders Placed This Encounter  Procedures   Magnesium   Vitamin B12   Basic metabolic panel   EKG 12-Lead   ECHOCARDIOGRAM COMPLETE   No orders of the defined types were placed in this encounter.   Patient Instructions  Medication Instructions:  Your physician recommends that you continue on your current medications as directed. Please refer to the Current Medication list given  to you today.  *If you need a refill on your cardiac medications before your next appointment, please call your pharmacy*  Lab Work: Your physician recommends that you return for lab work TODAY:  BMET Magnesium Vitamin B12 If you have labs (blood work) drawn today and your tests are completely normal, you will receive your results only by: MyChart Message (if you have MyChart) OR A paper copy in the mail If you have any lab test that is abnormal or we need to change your treatment, we will call you to review the results.  Testing/Procedures: Your physician has requested that you have an echocardiogram. Echocardiography is a painless test that uses sound waves to create images of your heart. It provides your doctor with information about the size and shape of your heart and how well your hearts chambers and valves are working. This procedure takes approximately one hour. There are no restrictions for this procedure.   Follow-Up: At [facility], you and your health needs are our priority.  As part of  our continuing mission to provide you with exceptional heart care, we have created designated Provider Care Teams.  These Care Teams include your primary Cardiologist (physician) and Advanced Practice Providers (APPs -  Physician Assistants and Nurse Practitioners) who all work together to provide you with the care you need, when you need it.  We recommend signing up for the patient portal called "MyChart".  Sign up information is provided on this After Visit Summary.  MyChart is used to connect with patients for Virtual Visits (Telemedicine).  Patients are able to view lab/test results, encounter notes, upcoming appointments, etc.  Non-urgent messages can be sent to your provider as well.   To learn more about what you can do with MyChart, go to [email or web address].    Your next appointment:   4 month(s)  The format for your next appointment:   In Person  Provider:   Thomasene Ripple, DO    Other Instructions     Adopting a Healthy Lifestyle.  Know what a healthy weight is for you (roughly BMI <25) and aim to maintain this   Aim for 7+ servings of fruits and vegetables daily   65-80+ fluid ounces of water or unsweet tea for healthy kidneys   Limit to max 1 drink of alcohol per day; avoid smoking/tobacco   Limit animal fats in diet for cholesterol and heart health - choose grass fed whenever available   Avoid highly processed foods, and foods high in saturated/trans fats   Aim for low stress - take time to unwind and care for your mental health   Aim for 150 min of moderate intensity exercise weekly for heart health, and weights twice weekly for bone health   Aim for 7-9 hours of sleep daily   When it comes to diets, agreement about the perfect plan isnt easy to find, even among the experts. Experts at the [facility] of [employer] developed an idea known as the Healthy Eating Plate. Just imagine a plate divided into logical, healthy portions.   The emphasis is on diet  quality:   Load up on vegetables and fruits - one-half of your plate: Aim for color and variety, and remember that potatoes dont count.   Go for whole grains - one-quarter of your plate: Whole wheat, barley, wheat berries, quinoa, oats, brown rice, and foods made with them. If you want pasta, go with whole wheat pasta.   Protein power - one-quarter of your plate:  Fish, chicken, beans, and nuts are all healthy, versatile protein sources. Limit red meat.   The diet, however, does go beyond the plate, offering a few other suggestions.   Use healthy plant oils, such as olive, canola, soy, corn, sunflower and peanut. Check the labels, and avoid partially hydrogenated oil, which have unhealthy trans fats.   If youre thirsty, drink water. Coffee and tea are good in moderation, but skip sugary drinks and limit milk and dairy products to one or two daily servings.   The type of carbohydrate in the diet is more important than the amount. Some sources of carbohydrates, such as vegetables, fruits, whole grains, and beans-are healthier than others.   Finally, stay active  Signed, Thomasene RippleKardie Lani Mendiola, DO  12/11/2021 8:59 PM    [facility]

## 2021-12-11 NOTE — Patient Instructions (Signed)
Medication Instructions:  Your physician recommends that you continue on your current medications as directed. Please refer to the Current Medication list given to you today.  *If you need a refill on your cardiac medications before your next appointment, please call your pharmacy*  Lab Work: Your physician recommends that you return for lab work TODAY:  BMET Magnesium Vitamin B12 If you have labs (blood work) drawn today and your tests are completely normal, you will receive your results only by: MyChart Message (if you have MyChart) OR A paper copy in the mail If you have any lab test that is abnormal or we need to change your treatment, we will call you to review the results.  Testing/Procedures: Your physician has requested that you have an echocardiogram. Echocardiography is a painless test that uses sound waves to create images of your heart. It provides your doctor with information about the size and shape of your heart and how well your hearts chambers and valves are working. This procedure takes approximately one hour. There are no restrictions for this procedure.   Follow-Up: At [facility], you and your health needs are our priority.  As part of our continuing mission to provide you with exceptional heart care, we have created designated Provider Care Teams.  These Care Teams include your primary Cardiologist (physician) and Advanced Practice Providers (APPs -  Physician Assistants and Nurse Practitioners) who all work together to provide you with the care you need, when you need it.  We recommend signing up for the patient portal called "MyChart".  Sign up information is provided on this After Visit Summary.  MyChart is used to connect with patients for Virtual Visits (Telemedicine).  Patients are able to view lab/test results, encounter notes, upcoming appointments, etc.  Non-urgent messages can be sent to your provider as well.   To learn more about what you can do with  MyChart, go to [email or web address].    Your next appointment:   4 month(s)  The format for your next appointment:   In Person  Provider:   Thomasene Ripple, DO    Other Instructions

## 2021-12-12 LAB — MAGNESIUM: Magnesium: 2.2 mg/dL (ref 1.6–2.3)

## 2021-12-12 LAB — BASIC METABOLIC PANEL
BUN/Creatinine Ratio: 19 (ref 12–28)
BUN: 14 mg/dL (ref 8–27)
CO2: 24 mmol/L (ref 20–29)
Calcium: 9.5 mg/dL (ref 8.7–10.3)
Chloride: 105 mmol/L (ref 96–106)
Creatinine, Ser: 0.72 mg/dL (ref 0.57–1.00)
Glucose: 98 mg/dL (ref 70–99)
Potassium: 4.5 mmol/L (ref 3.5–5.2)
Sodium: 143 mmol/L (ref 134–144)
eGFR: 83 mL/min/{1.73_m2} (ref >59)

## 2021-12-12 LAB — VITAMIN B12: Vitamin B-12: 244 pg/mL (ref 232–1245)

## 2021-12-31 ENCOUNTER — Ambulatory Visit (HOSPITAL_COMMUNITY): Payer: Medicare Other | Attending: Cardiology

## 2021-12-31 ENCOUNTER — Other Ambulatory Visit: Payer: Self-pay

## 2021-12-31 DIAGNOSIS — G459 Transient cerebral ischemic attack, unspecified: Secondary | ICD-10-CM | POA: Diagnosis present

## 2021-12-31 LAB — ECHOCARDIOGRAM COMPLETE
Area-P 1/2: 3.27 cm2
S' Lateral: 1.6 cm

## 2022-01-18 ENCOUNTER — Emergency Department (HOSPITAL_COMMUNITY): Payer: Medicare Other

## 2022-01-18 ENCOUNTER — Encounter (HOSPITAL_COMMUNITY): Payer: Self-pay | Admitting: Emergency Medicine

## 2022-01-18 ENCOUNTER — Other Ambulatory Visit: Payer: Self-pay

## 2022-01-18 ENCOUNTER — Inpatient Hospital Stay (HOSPITAL_COMMUNITY)
Admission: EM | Admit: 2022-01-18 | Discharge: 2022-01-22 | DRG: 552 | Disposition: A | Discharge status: Hospice | Payer: Medicare Other | Attending: Internal Medicine | Admitting: Internal Medicine

## 2022-01-18 DIAGNOSIS — R636 Underweight: Secondary | ICD-10-CM | POA: Diagnosis present

## 2022-01-18 DIAGNOSIS — Z20822 Contact with and (suspected) exposure to covid-19: Secondary | ICD-10-CM | POA: Diagnosis present

## 2022-01-18 DIAGNOSIS — Z79899 Other long term (current) drug therapy: Secondary | ICD-10-CM

## 2022-01-18 DIAGNOSIS — Z818 Family history of other mental and behavioral disorders: Secondary | ICD-10-CM

## 2022-01-18 DIAGNOSIS — F0283 Dementia in other diseases classified elsewhere, unspecified severity, with mood disturbance: Secondary | ICD-10-CM | POA: Diagnosis present

## 2022-01-18 DIAGNOSIS — F17210 Nicotine dependence, cigarettes, uncomplicated: Secondary | ICD-10-CM | POA: Diagnosis present

## 2022-01-18 DIAGNOSIS — R270 Ataxia, unspecified: Secondary | ICD-10-CM | POA: Diagnosis not present

## 2022-01-18 DIAGNOSIS — Z681 Body mass index (BMI) 19 or less, adult: Secondary | ICD-10-CM

## 2022-01-18 DIAGNOSIS — R531 Weakness: Secondary | ICD-10-CM

## 2022-01-18 DIAGNOSIS — W19XXXA Unspecified fall, initial encounter: Secondary | ICD-10-CM

## 2022-01-18 DIAGNOSIS — Z720 Tobacco use: Secondary | ICD-10-CM | POA: Diagnosis present

## 2022-01-18 DIAGNOSIS — I10 Essential (primary) hypertension: Secondary | ICD-10-CM | POA: Diagnosis not present

## 2022-01-18 DIAGNOSIS — F02818 Dementia in other diseases classified elsewhere, unspecified severity, with other behavioral disturbance: Secondary | ICD-10-CM | POA: Diagnosis present

## 2022-01-18 DIAGNOSIS — M4802 Spinal stenosis, cervical region: Secondary | ICD-10-CM | POA: Diagnosis not present

## 2022-01-18 DIAGNOSIS — Y92009 Unspecified place in unspecified non-institutional (private) residence as the place of occurrence of the external cause: Secondary | ICD-10-CM

## 2022-01-18 DIAGNOSIS — G309 Alzheimer's disease, unspecified: Secondary | ICD-10-CM | POA: Diagnosis present

## 2022-01-18 DIAGNOSIS — M62422 Contracture of muscle, left upper arm: Secondary | ICD-10-CM | POA: Diagnosis present

## 2022-01-18 DIAGNOSIS — F209 Schizophrenia, unspecified: Secondary | ICD-10-CM | POA: Diagnosis present

## 2022-01-18 DIAGNOSIS — M4712 Other spondylosis with myelopathy, cervical region: Secondary | ICD-10-CM | POA: Diagnosis present

## 2022-01-18 DIAGNOSIS — F0390 Unspecified dementia without behavioral disturbance: Secondary | ICD-10-CM | POA: Diagnosis present

## 2022-01-18 DIAGNOSIS — R296 Repeated falls: Secondary | ICD-10-CM

## 2022-01-18 DIAGNOSIS — E785 Hyperlipidemia, unspecified: Secondary | ICD-10-CM | POA: Diagnosis present

## 2022-01-18 DIAGNOSIS — Z7982 Long term (current) use of aspirin: Secondary | ICD-10-CM

## 2022-01-18 LAB — COMPREHENSIVE METABOLIC PANEL
ALT: 25 U/L (ref 0–44)
AST: 25 U/L (ref 15–41)
Albumin: 4.1 g/dL (ref 3.5–5.0)
Alkaline Phosphatase: 120 U/L (ref 38–126)
Anion gap: 7 (ref 5–15)
BUN: 18 mg/dL (ref 8–23)
CO2: 28 mmol/L (ref 22–32)
Calcium: 9.3 mg/dL (ref 8.9–10.3)
Chloride: 106 mmol/L (ref 98–111)
Creatinine, Ser: 0.64 mg/dL (ref 0.44–1.00)
GFR, Estimated: >60 mL/min (ref >60)
Glucose, Bld: 94 mg/dL (ref 70–99)
Potassium: 3.9 mmol/L (ref 3.5–5.1)
Sodium: 141 mmol/L (ref 135–145)
Total Bilirubin: 0.4 mg/dL (ref 0.3–1.2)
Total Protein: 7.6 g/dL (ref 6.5–8.1)

## 2022-01-18 LAB — CBC
HCT: 37.6 % (ref 36.0–46.0)
Hemoglobin: 12.5 g/dL (ref 12.0–15.0)
MCH: 32.4 pg (ref 26.0–34.0)
MCHC: 33.2 g/dL (ref 30.0–36.0)
MCV: 97.4 fL (ref 80.0–100.0)
Platelets: 317 10*3/uL (ref 150–400)
RBC: 3.86 MIL/uL — ABNORMAL LOW (ref 3.87–5.11)
RDW: 13.2 % (ref 11.5–15.5)
WBC: 6.3 10*3/uL (ref 4.0–10.5)
nRBC: 0 % (ref 0.0–0.2)

## 2022-01-18 LAB — DIFFERENTIAL
Abs Immature Granulocytes: 0.04 10*3/uL (ref 0.00–0.07)
Basophils Absolute: 0 10*3/uL (ref 0.0–0.1)
Basophils Relative: 1 %
Eosinophils Absolute: 0.1 10*3/uL (ref 0.0–0.5)
Eosinophils Relative: 1 %
Immature Granulocytes: 1 %
Lymphocytes Relative: 21 %
Lymphs Abs: 1.3 10*3/uL (ref 0.7–4.0)
Monocytes Absolute: 0.9 10*3/uL (ref 0.1–1.0)
Monocytes Relative: 14 %
Neutro Abs: 3.8 10*3/uL (ref 1.7–7.7)
Neutrophils Relative %: 62 %

## 2022-01-18 LAB — I-STAT CHEM 8, ED
BUN: 16 mg/dL (ref 8–23)
Calcium, Ion: 1.15 mmol/L (ref 1.15–1.40)
Chloride: 105 mmol/L (ref 98–111)
Creatinine, Ser: 0.5 mg/dL (ref 0.44–1.00)
Glucose, Bld: 90 mg/dL (ref 70–99)
HCT: 38 % (ref 36.0–46.0)
Hemoglobin: 12.9 g/dL (ref 12.0–15.0)
Potassium: 4 mmol/L (ref 3.5–5.1)
Sodium: 142 mmol/L (ref 135–145)
TCO2: 28 mmol/L (ref 22–32)

## 2022-01-18 LAB — ETHANOL: Alcohol, Ethyl (B): <10 mg/dL (ref <10)

## 2022-01-18 LAB — RESP PANEL BY RT-PCR (FLU A&B, COVID) ARPGX2
Influenza A by PCR: NEGATIVE
Influenza B by PCR: NEGATIVE
SARS Coronavirus 2 by RT PCR: NEGATIVE

## 2022-01-18 LAB — APTT: aPTT: 26 seconds (ref 24–36)

## 2022-01-18 LAB — PROTIME-INR
INR: 1 (ref 0.8–1.2)
Prothrombin Time: 13.1 seconds (ref 11.4–15.2)

## 2022-01-18 IMAGING — MR MR HEAD W/O CM
6 series · 48 of 48 positions shown · non-contrast
Comparison: [DATE].

CLINICAL DATA: Stroke suspected

EXAM:
MRI HEAD WITHOUT CONTRAST
TECHNIQUE: Multiplanar, multiecho pulse sequences of the brain and surrounding
structures were obtained without intravenous contrast.

[Series 10: dwi_tracew · axial · 3.0mm · 1.08mm/px · z∈[+9,+129]mm · 11 of 86 slices shown]
[im 1/86]
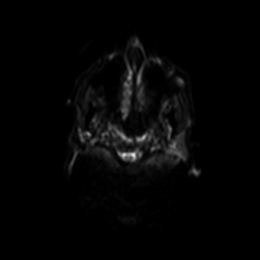
[im 9/86]
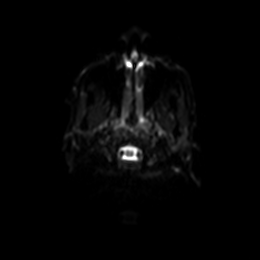
[im 18/86]
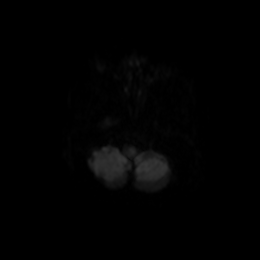
[im 26/86]
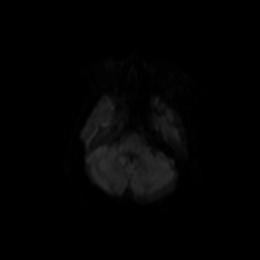
[im 35/86]
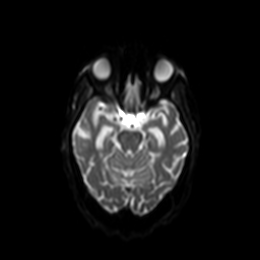
[im 43/86]
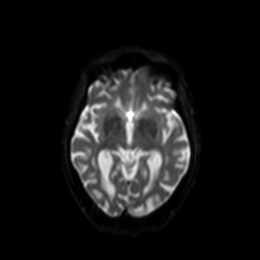
[im 52/86]
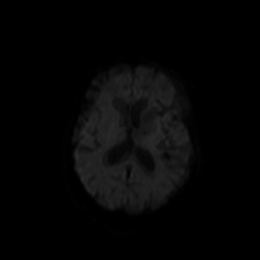
[im 60/86]
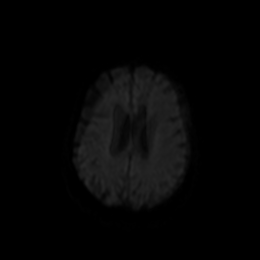
[im 69/86]
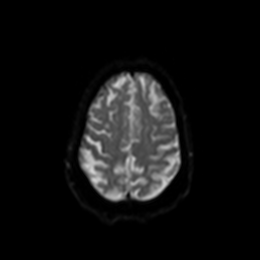
[im 77/86]
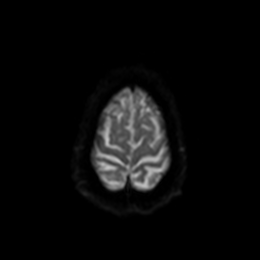
[im 86/86]
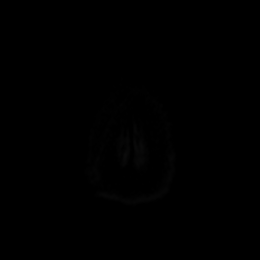

[Series 11: dwi_adc · axial · 3.0mm · 1.08mm/px · z∈[+9,+129]mm · 5 of 43 slices shown]
[im 1/43]
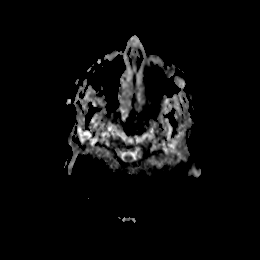
[im 11/43]
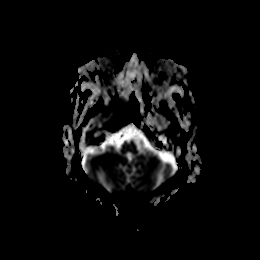
[im 22/43]
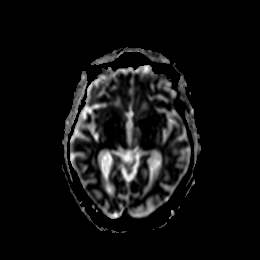
[im 32/43]
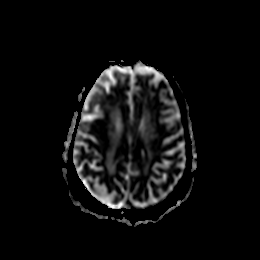
[im 43/43]
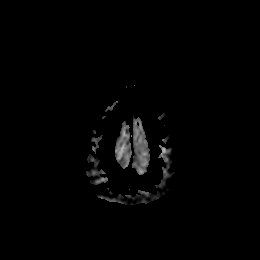

[Series 12: GRE · axial · 3.0mm · 0.45mm/px · z∈[+1,+121]mm · 5 of 43 slices shown]
[im 1/43]
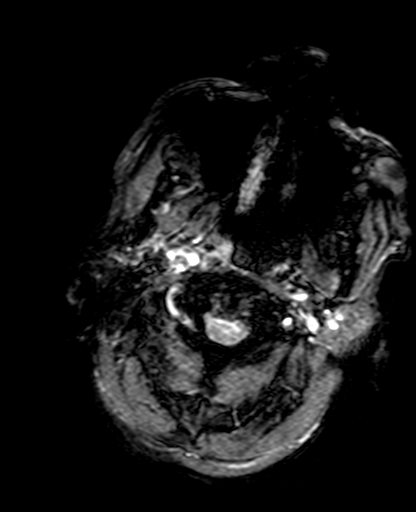
[im 11/43]
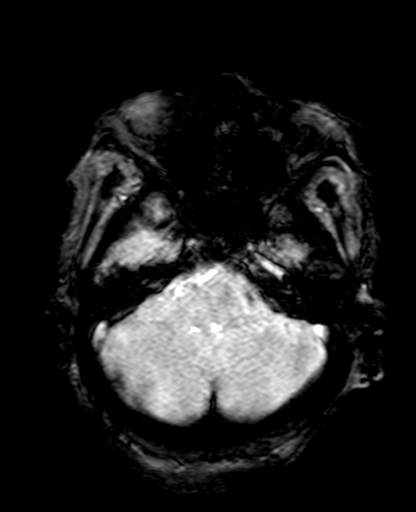
[im 22/43]
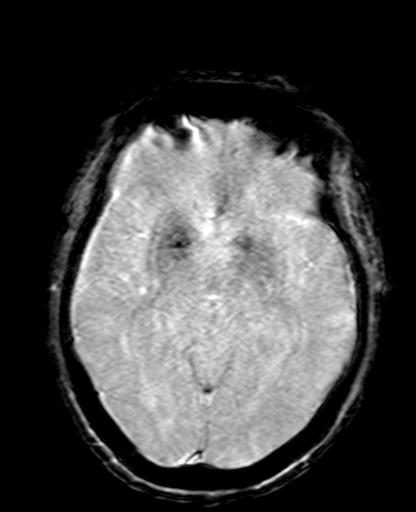
[im 32/43]
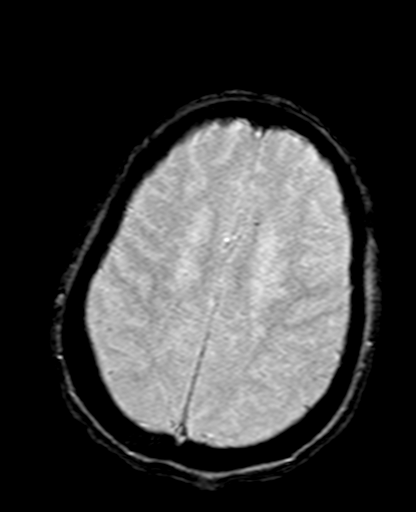
[im 43/43]
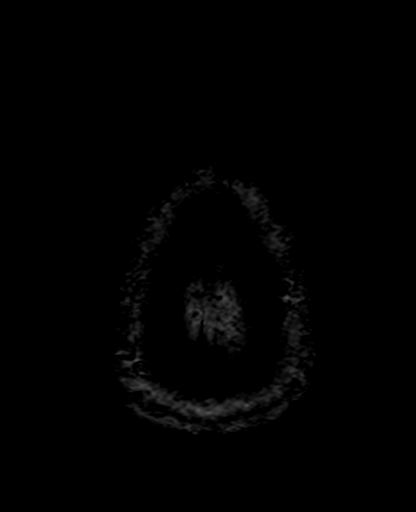

[Series 13: FLAIR · axial · 3.0mm · 0.86mm/px · z∈[-7,+120]mm · 6 of 45 slices shown]
[im 1/45]
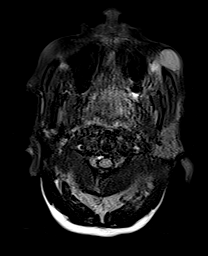
[im 9/45]
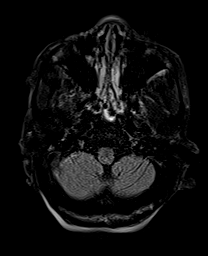
[im 18/45]
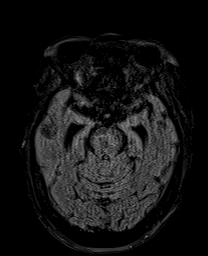
[im 27/45]
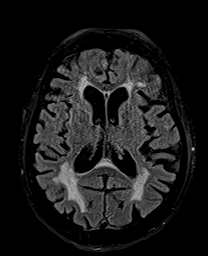
[im 36/45]
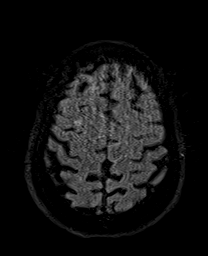
[im 45/45]
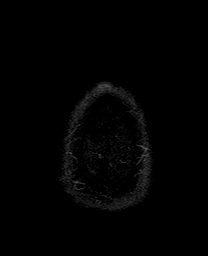

[Series 18: cor dwi_tracew · coronal · 3.0mm · 1.08mm/px · 14 of 112 slices shown]
[im 1/112]
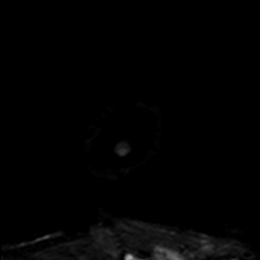
[im 9/112]
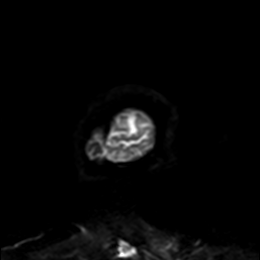
[im 18/112]
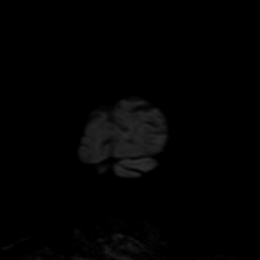
[im 26/112]
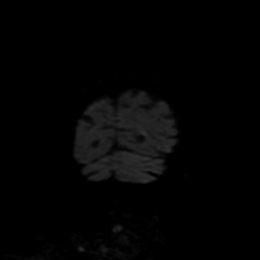
[im 35/112]
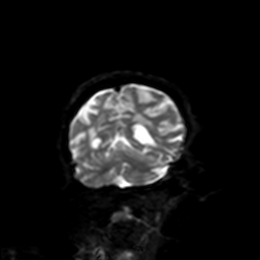
[im 43/112]
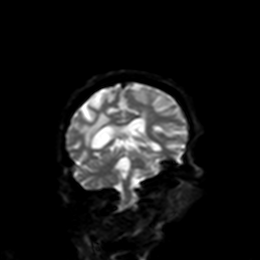
[im 52/112]
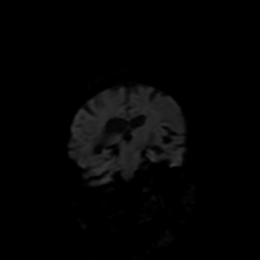
[im 60/112]
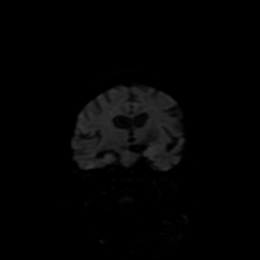
[im 69/112]
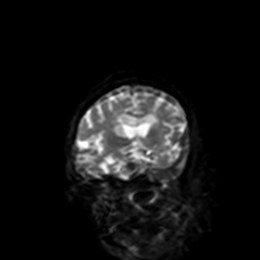
[im 77/112]
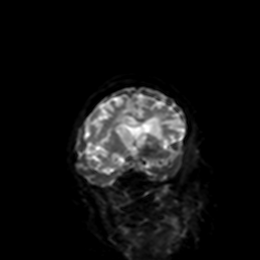
[im 86/112]
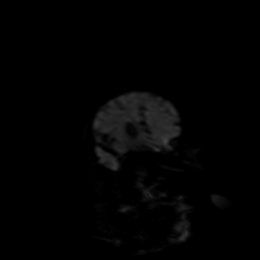
[im 94/112]
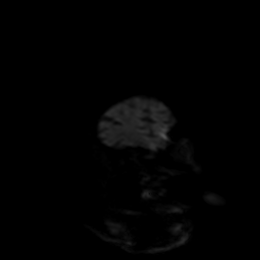
[im 103/112]
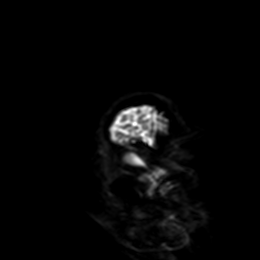
[im 112/112]
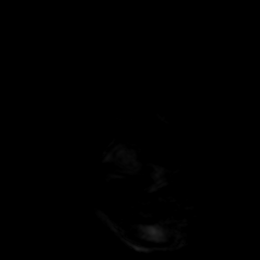

[Series 19: cor dwi_adc · coronal · 3.0mm · 1.08mm/px · 7 of 56 slices shown]
[im 1/56]
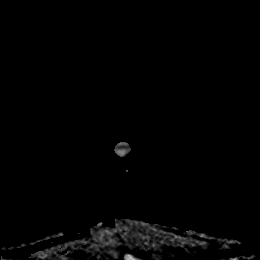
[im 10/56]
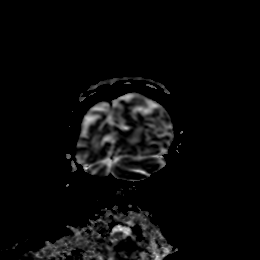
[im 19/56]
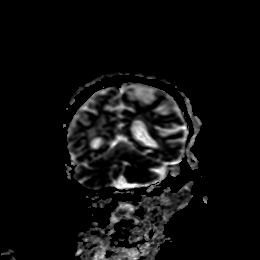
[im 28/56]
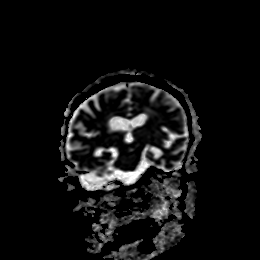
[im 37/56]
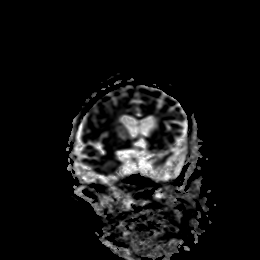
[im 46/56]
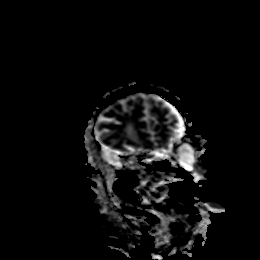
[im 56/56]
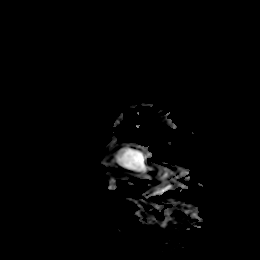

[48 of 48 positions shown; findings below may reference images not displayed]

FINDINGS: Evaluation is limited by motion artifact and limited sequences
obtained.

Brain: No restricted diffusion to suggest acute or subacute infarct.
No evidence of acute or remote hemorrhage. Confluent T2 hyperintense
signal in the periventricular white matter, likely the sequela of
severe chronic small vessel ischemic disease. Degree of cerebral
volume loss is likely within normal limits for age. No hydrocephalus
or extra-axial collection.

Vascular: Evaluation is limited by the absence of the standard T2
weighted sequence, however on the available sequences, normal flow
voids are seen. Loss of the right transverse and sigmoid sinus flow
void appears unchanged compared to the prior exam and likely
reflects slow flow in the non dominant sinus.

Skull and upper cervical spine: Normal on these limited sequences.

Sinuses/Orbits: Negative.

Other: None.
IMPRESSION: Evaluation is significantly limited by motion artifact and
abbreviated exam. Within this limitation, no acute or subacute
infarct. No evidence of hemorrhage.

## 2022-01-18 IMAGING — CT CT CERVICAL SPINE W/O CM
3 of 4 series · 12 of 33 positions shown, 14 images · non-contrast
Comparison: [DATE]

CLINICAL DATA: I would want is fall earlier today.



[Series 5: orthogonal bone · axial · 0.23mm/px · z∈[-247,-132]mm · 4 of 90 slices shown, 5 images]
[im 13/90  soft-tissue]
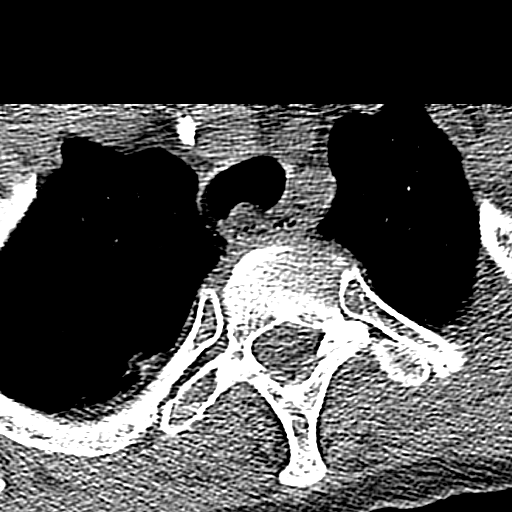
[im 13/90  bone]
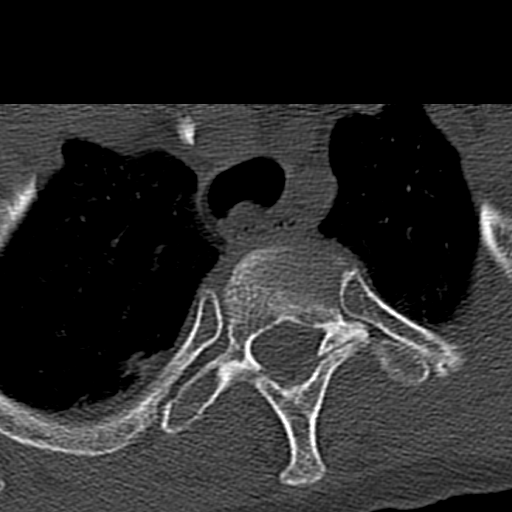
[im 39/90  bone]
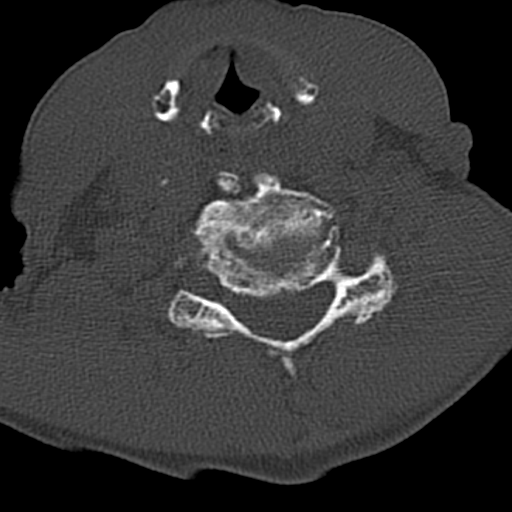
[im 51/90  bone]
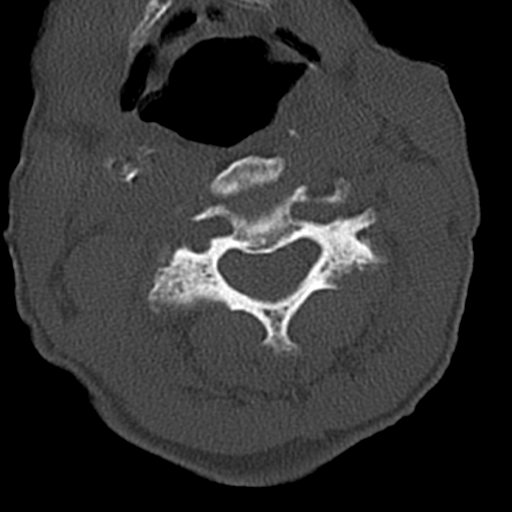
[im 77/90  bone]
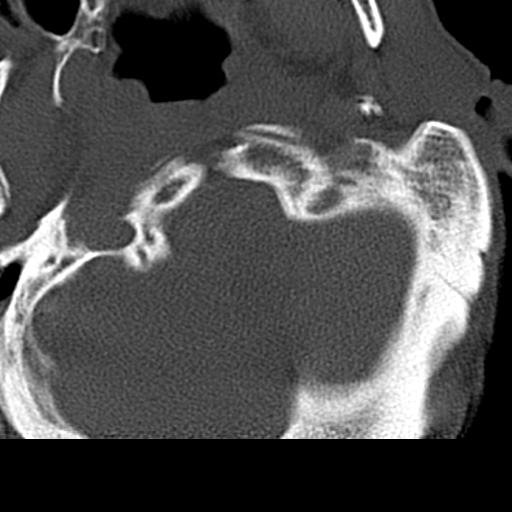

[Series 6: coronal bone · coronal · 0.23mm/px · 3 of 61 slices shown]
[im 14/61  bone]
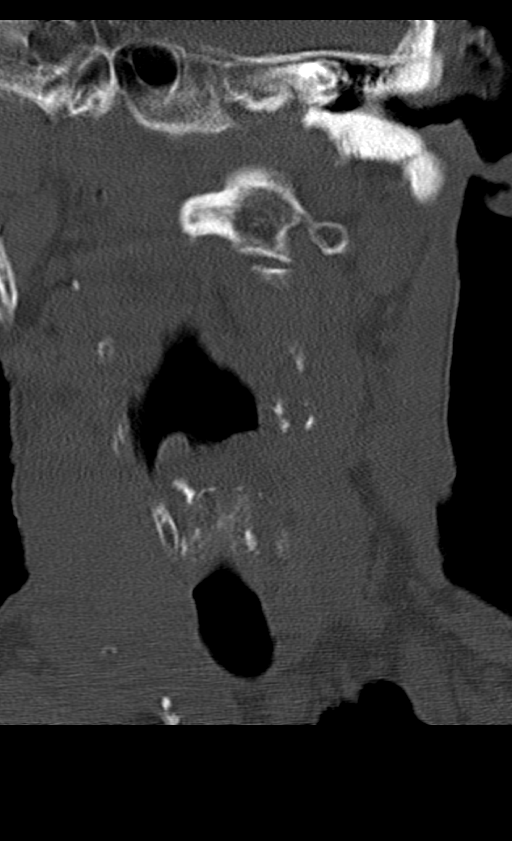
[im 25/61  bone]
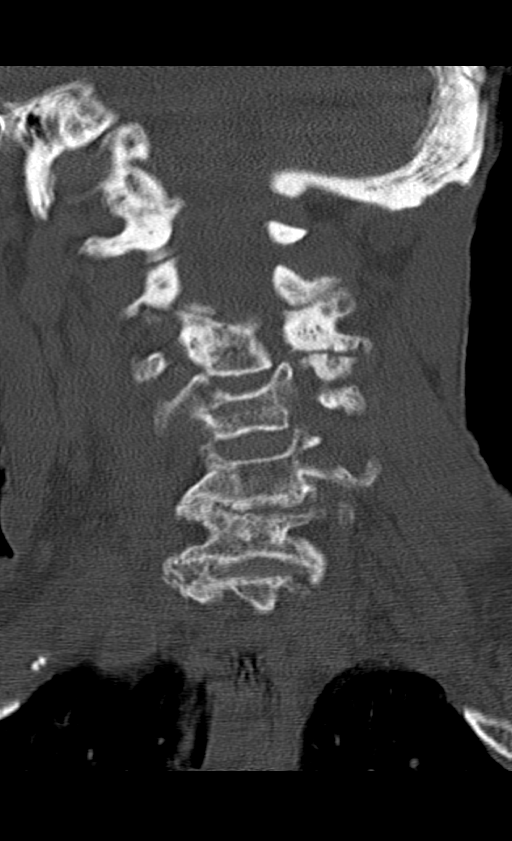
[im 36/61  bone]
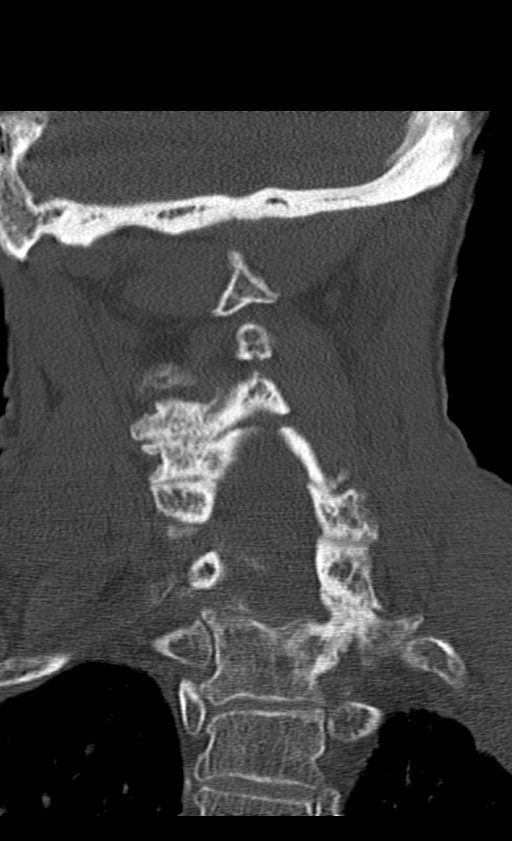

[Series 7: sagittal bone · sagittal · 0.27mm/px · 5 of 55 slices shown, 6 images]
[im 19/55  bone]
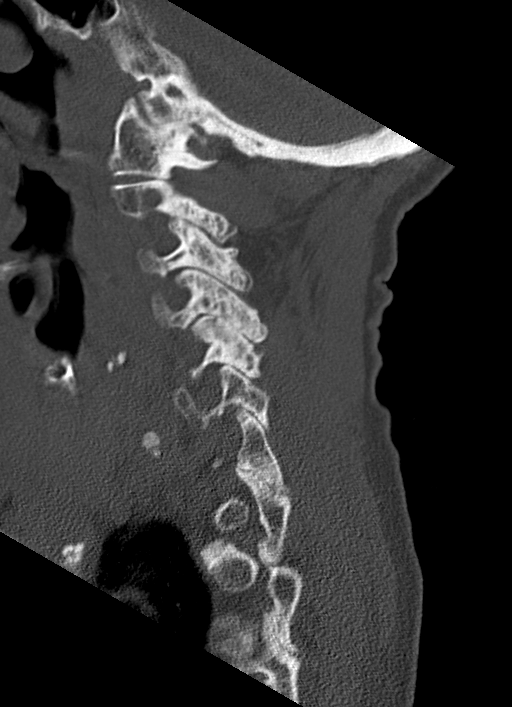
[im 23/55  bone]
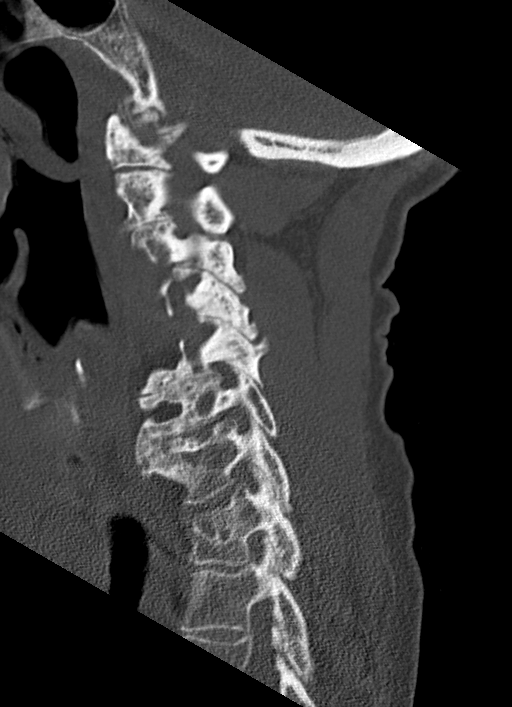
[im 28/55  soft-tissue]
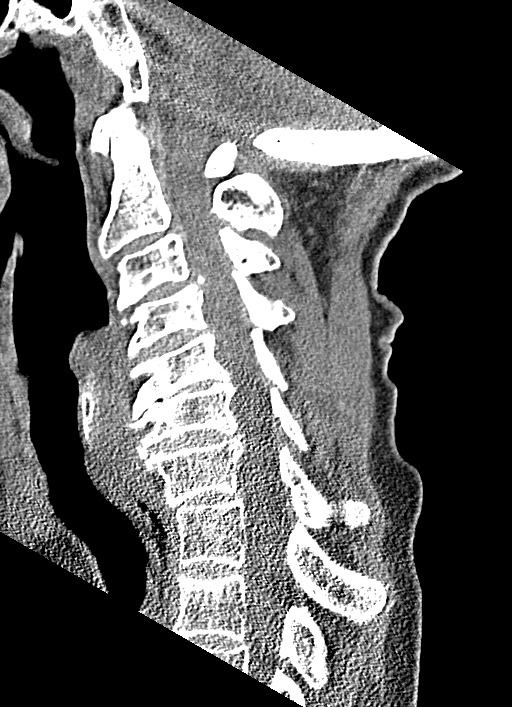
[im 28/55  bone]
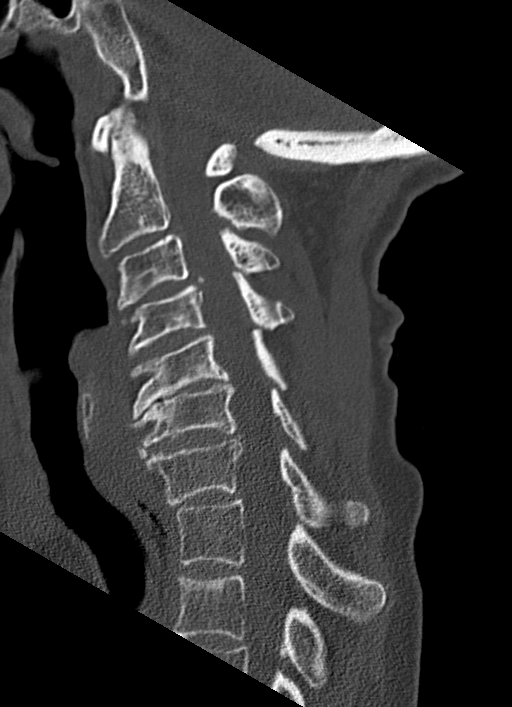
[im 32/55  bone]
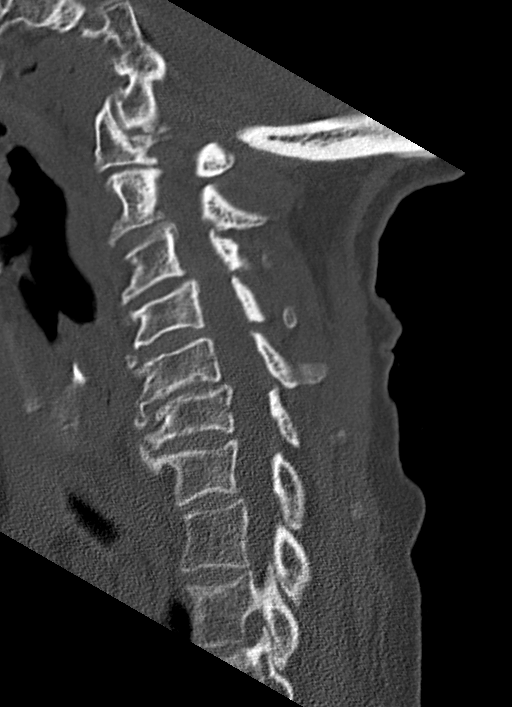
[im 37/55  bone]
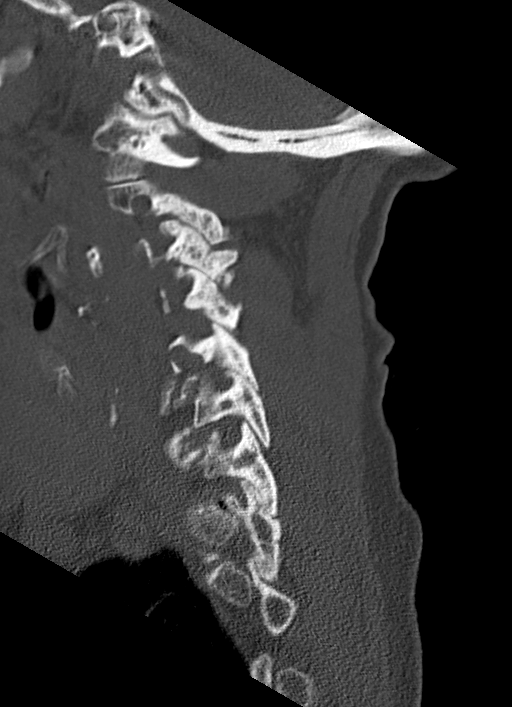

[12 of 33 positions shown; findings below may reference images not displayed]

FINDINGS: CT HEAD FINDINGS

Brain: There is no evidence for acute hemorrhage, hydrocephalus,
mass lesion, or abnormal extra-axial fluid collection. No definite
CT evidence for acute infarction. Diffuse loss of parenchymal volume
is consistent with atrophy. Patchy low attenuation in the deep
hemispheric and periventricular white matter is nonspecific, but
likely reflects chronic microvascular ischemic demyelination.

Vascular: No hyperdense vessel or unexpected calcification.

Skull: No evidence for fracture. No worrisome lytic or sclerotic
lesion.

Sinuses/Orbits: The visualized paranasal sinuses and mastoid air
cells are clear. Visualized portions of the globes and intraorbital
fat are unremarkable.

Other: None.

CT CERVICAL SPINE FINDINGS

Alignment: Mild reversal of normal cervical lordosis without
subluxation.

Skull base and vertebrae: No acute fracture. No primary bone lesion
or focal pathologic process.

Soft tissues and spinal canal: No prevertebral fluid or swelling. No
visible canal hematoma.

Disc levels: Loss of disc height with endplate spurring noted C5-6.
Prominent endplate spurring noted anteriorly at C6-7. Advanced facet
osteoarthritis noted bilaterally in the upper and mid cervical
levels.

Upper chest: Unremarkable.

Other: None.
IMPRESSION: 1. No acute intracranial abnormality.
2. Atrophy with chronic small vessel ischemic disease.
3. No cervical spine fracture or subluxation.
4. Degenerative changes in the cervical spine as above.

## 2022-01-18 IMAGING — CT CT HEAD W/O CM
3 series · 14 of 46 positions shown, 16 images · non-contrast
Comparison: [DATE]

CLINICAL DATA: I would want is fall earlier today.



[Series 2: head wo · axial · 0.47mm/px · z∈[-102,+18]mm · 8 of 29 slices shown, 10 images]
[im 3/29  brain]
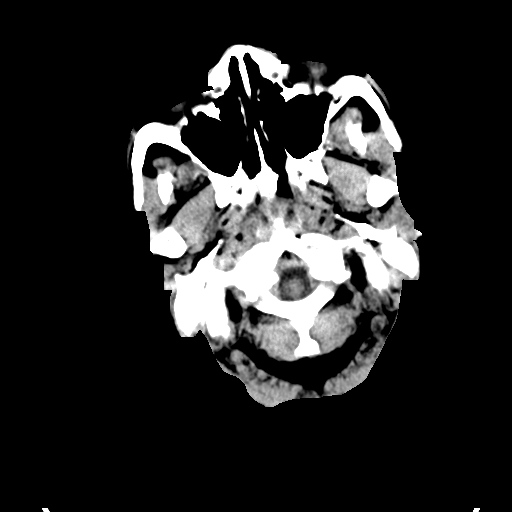
[im 3/29  bone]
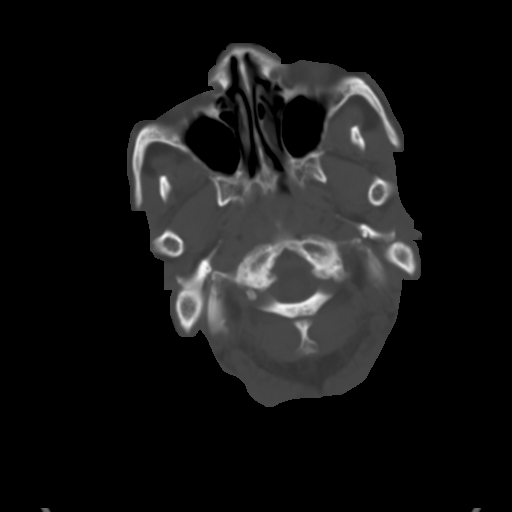
[im 7/29  brain]
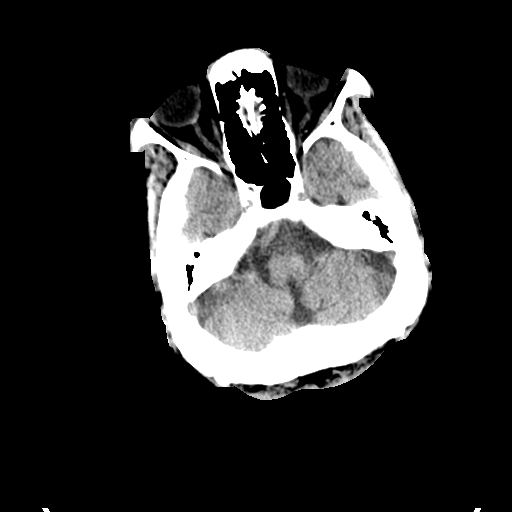
[im 10/29  brain]
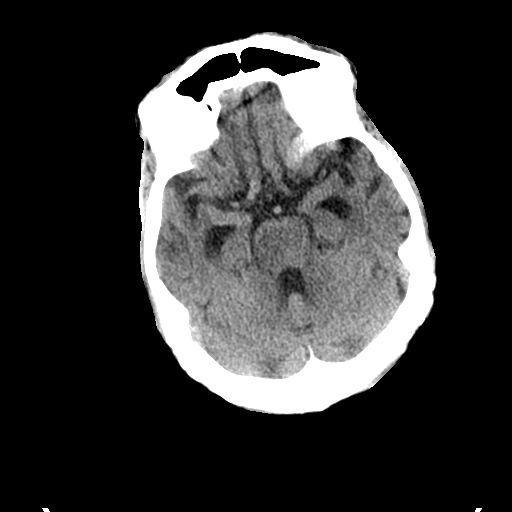
[im 13/29  brain]
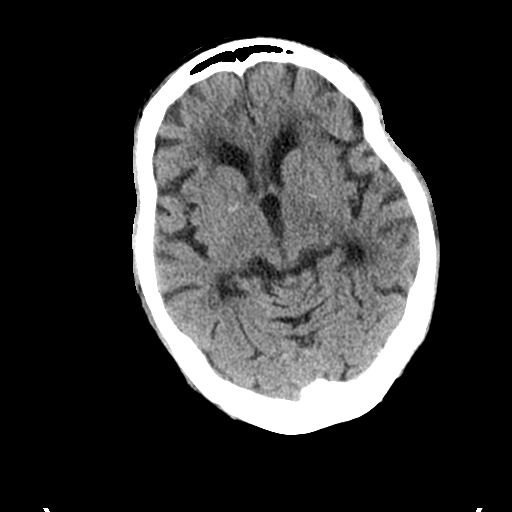
[im 17/29  brain]
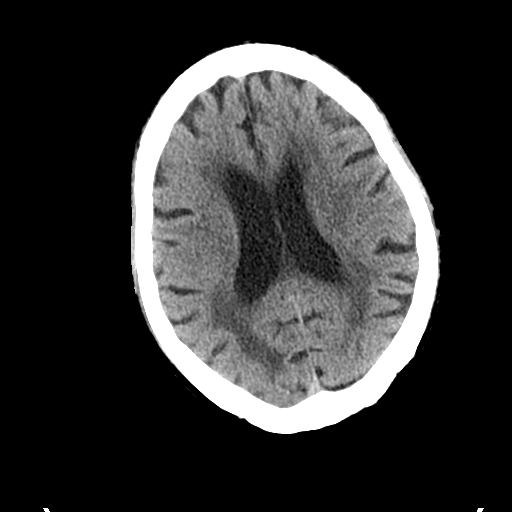
[im 17/29  bone]
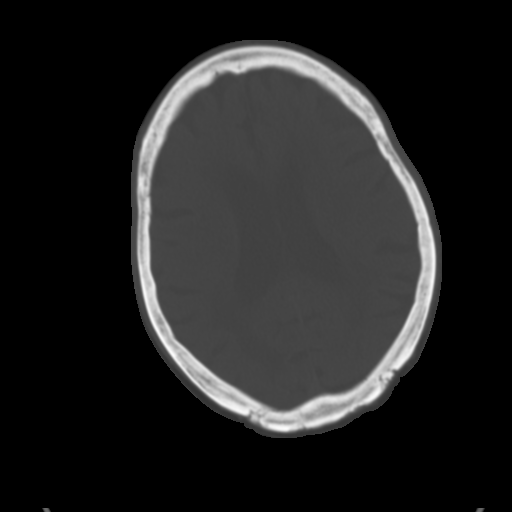
[im 20/29  brain]
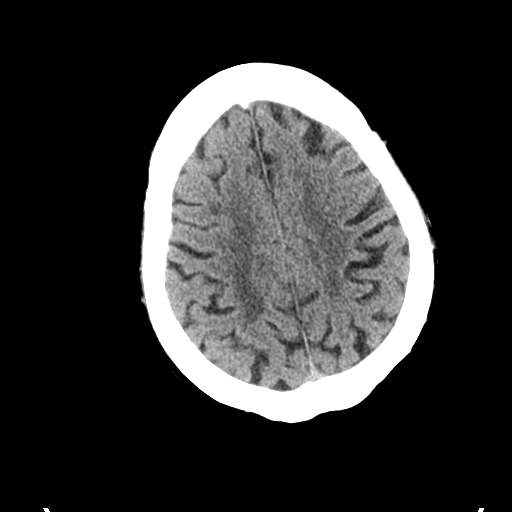
[im 23/29  brain]
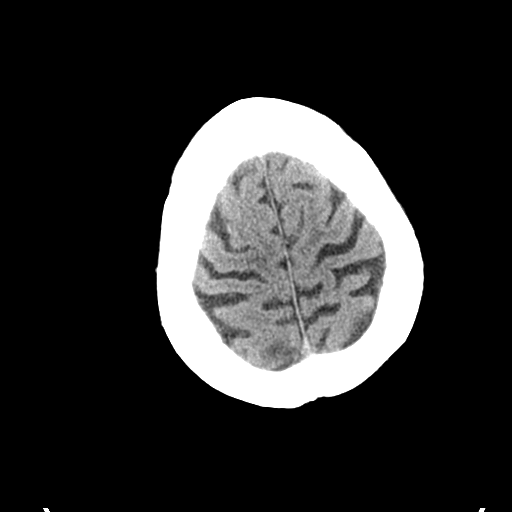
[im 27/29  brain]
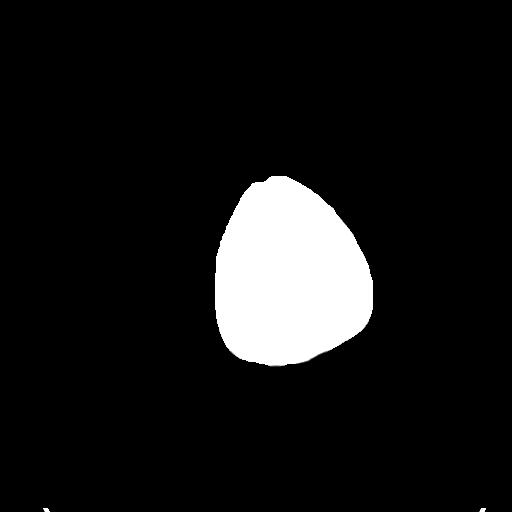

[Series 5: coronal soft tissue · coronal · 0.29mm/px · 3 of 74 slices shown]
[im 25/74  brain]
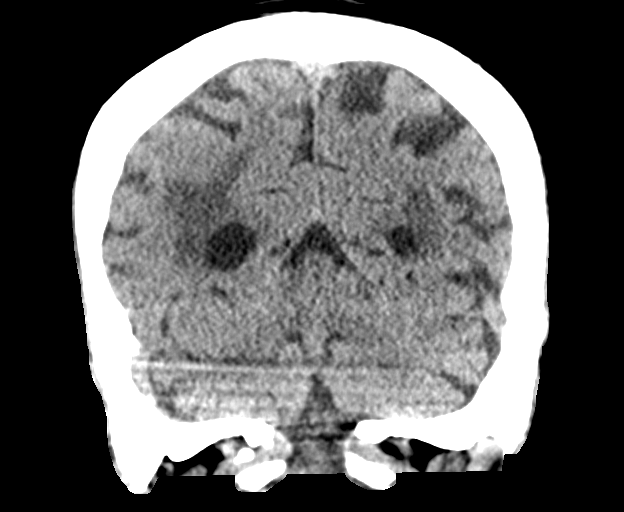
[im 33/74  brain]
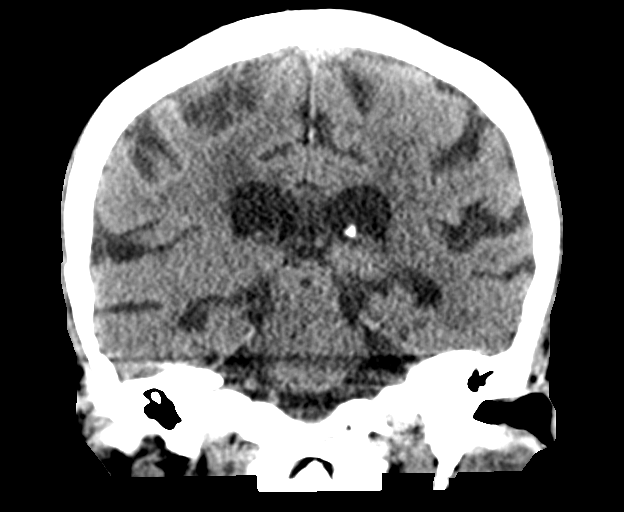
[im 41/74  brain]
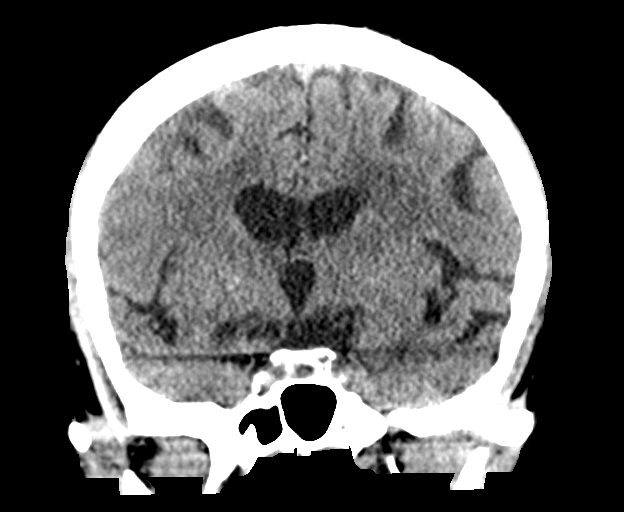

[Series 6: sagittal soft tissue · sagittal · 0.29mm/px · 3 of 60 slices shown]
[im 20/60  brain]
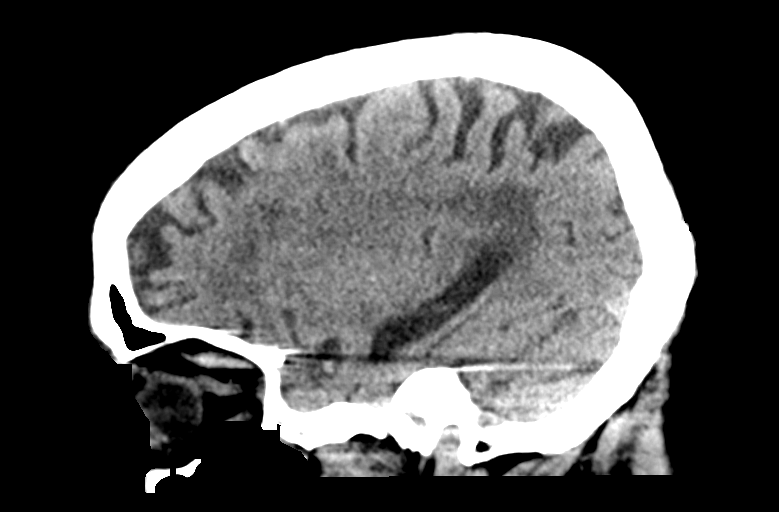
[im 30/60  brain]
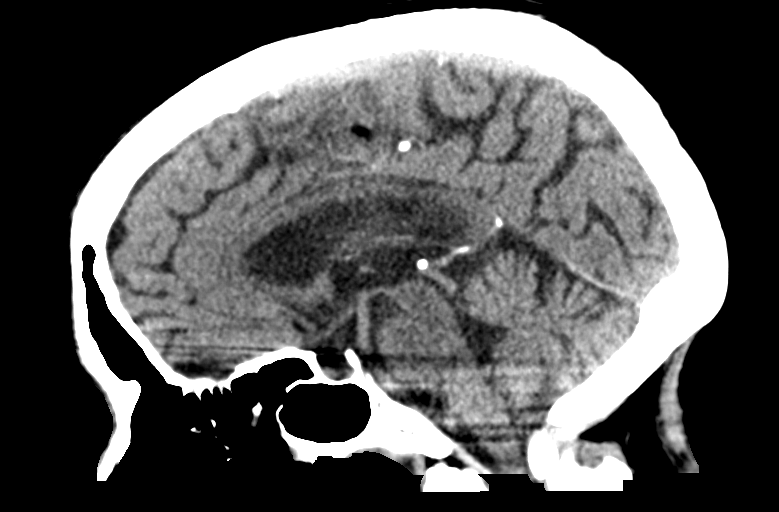
[im 40/60  brain]
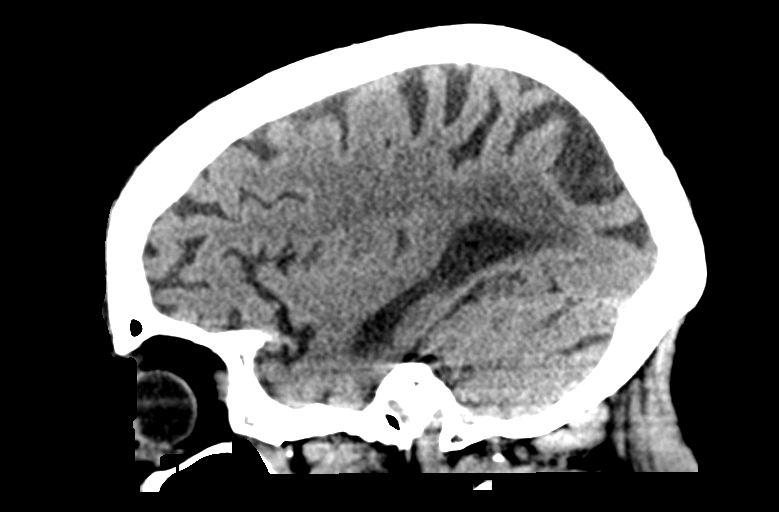

[14 of 46 positions shown; findings below may reference images not displayed]

FINDINGS: CT HEAD FINDINGS

Brain: There is no evidence for acute hemorrhage, hydrocephalus,
mass lesion, or abnormal extra-axial fluid collection. No definite
CT evidence for acute infarction. Diffuse loss of parenchymal volume
is consistent with atrophy. Patchy low attenuation in the deep
hemispheric and periventricular white matter is nonspecific, but
likely reflects chronic microvascular ischemic demyelination.

Vascular: No hyperdense vessel or unexpected calcification.

Skull: No evidence for fracture. No worrisome lytic or sclerotic
lesion.

Sinuses/Orbits: The visualized paranasal sinuses and mastoid air
cells are clear. Visualized portions of the globes and intraorbital
fat are unremarkable.

Other: None.

CT CERVICAL SPINE FINDINGS

Alignment: Mild reversal of normal cervical lordosis without
subluxation.

Skull base and vertebrae: No acute fracture. No primary bone lesion
or focal pathologic process.

Soft tissues and spinal canal: No prevertebral fluid or swelling. No
visible canal hematoma.

Disc levels: Loss of disc height with endplate spurring noted C5-6.
Prominent endplate spurring noted anteriorly at C6-7. Advanced facet
osteoarthritis noted bilaterally in the upper and mid cervical
levels.

Upper chest: Unremarkable.

Other: None.
IMPRESSION: 1. No acute intracranial abnormality.
2. Atrophy with chronic small vessel ischemic disease.
3. No cervical spine fracture or subluxation.
4. Degenerative changes in the cervical spine as above.

## 2022-01-18 MED ORDER — LORAZEPAM 2 MG/ML IJ SOLN: 1.0000 mg | INTRAMUSCULAR | Status: DC | PRN

## 2022-01-18 MED ORDER — ACETAMINOPHEN 650 MG RE SUPP: 650.0000 mg | Freq: Four times a day (QID) | RECTAL | Status: DC | PRN

## 2022-01-18 MED ORDER — POLYETHYLENE GLYCOL 3350 17 G PO PACK: 17.0000 g | PACK | Freq: Every day | ORAL | Status: DC | PRN

## 2022-01-18 MED ORDER — LORAZEPAM 0.5 MG PO TABS: 0.5000 mg | ORAL_TABLET | ORAL | Status: DC | PRN

## 2022-01-18 MED ORDER — AMLODIPINE BESYLATE 10 MG PO TABS
10.0000 mg | ORAL_TABLET | Freq: Every day | ORAL | Status: DC
  Administered 2022-01-20 – 2022-01-21 (×2): 10 mg via ORAL
  Filled 2022-01-18: qty 2
  Filled 2022-01-18 (×2): qty 1

## 2022-01-18 MED ORDER — SIMVASTATIN 5 MG PO TABS
5.0000 mg | ORAL_TABLET | Freq: Every day | ORAL | Status: DC
  Administered 2022-01-20: 5 mg via ORAL
  Filled 2022-01-18 (×2): qty 1

## 2022-01-18 MED ORDER — HALOPERIDOL LACTATE 5 MG/ML IJ SOLN: 2.0000 mg | Freq: Once | INTRAMUSCULAR | Status: DC

## 2022-01-18 MED ORDER — NICOTINE 21 MG/24HR TD PT24: 21.0000 mg | MEDICATED_PATCH | Freq: Every day | TRANSDERMAL | Status: DC

## 2022-01-18 MED ORDER — ASPIRIN EC 81 MG PO TBEC
81.0000 mg | DELAYED_RELEASE_TABLET | Freq: Every day | ORAL | Status: DC
  Administered 2022-01-20 – 2022-01-21 (×2): 81 mg via ORAL
  Filled 2022-01-18 (×3): qty 1

## 2022-01-18 MED ORDER — SODIUM CHLORIDE 0.9% FLUSH
3.0000 mL | Freq: Two times a day (BID) | INTRAVENOUS | Status: DC
  Administered 2022-01-19 – 2022-01-20 (×3): 3 mL via INTRAVENOUS

## 2022-01-18 MED ORDER — HALOPERIDOL LACTATE 5 MG/ML IJ SOLN
5.0000 mg | Freq: Once | INTRAMUSCULAR | Status: AC
  Administered 2022-01-18: 5 mg via INTRAVENOUS
  Filled 2022-01-18: qty 1

## 2022-01-18 MED ORDER — LORAZEPAM 2 MG/ML IJ SOLN
1.0000 mg | INTRAMUSCULAR | Status: AC | PRN
  Administered 2022-01-18: 1 mg via INTRAVENOUS
  Filled 2022-01-18: qty 1

## 2022-01-18 MED ORDER — ACETAMINOPHEN 325 MG PO TABS
650.0000 mg | ORAL_TABLET | Freq: Four times a day (QID) | ORAL | Status: DC | PRN
  Administered 2022-01-21: 650 mg via ORAL
  Filled 2022-01-18: qty 2

## 2022-01-18 MED ORDER — ENOXAPARIN SODIUM 30 MG/0.3ML IJ SOSY
30.0000 mg | PREFILLED_SYRINGE | INTRAMUSCULAR | Status: DC
  Administered 2022-01-18 – 2022-01-19 (×2): 30 mg via SUBCUTANEOUS
  Filled 2022-01-18 (×3): qty 0.3

## 2022-01-18 MED ORDER — GADOBUTROL 1 MMOL/ML IV SOLN
5.0000 mL | Freq: Once | INTRAVENOUS | Status: AC | PRN
  Administered 2022-01-18: 5 mL via INTRAVENOUS

## 2022-01-18 NOTE — H&P (Signed)
History and Physical   Janice Castro SWF:[id number] DOB: 11/17/1936 DOA: 01/18/2022  PCP: Rometta Emery, MD   Patient coming from: Home  Chief Complaint: Fall  HPI: Janice Castro is a 86 y.o. female with medical history significant of schizophrenia, anemia, hypertension, tobacco use, hyperlipidemia, dementia (newly diagnosed) who presents after a fall at home.  History obtained with assistance of patient's daughter.  Patient had a fall at home earlier today.  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  She fell earlier today and the fall was unwitnessed.  It is not known if she had any loss of consciousness.  Her daughter reports she is at her baseline mental status.  She has as above new contractures in her left upper extremities and appears to be developing contractures in her right upper extremity.  No history of CVA did have a negative MRI in December of last year and has further MRI and MRA ordered outpatient.  This is to evaluate for possible vascular etiology.  Other labs have been ordered, have resulted in some remain pending.  Difficult to obtain review of systems given patient's dementia.  She does not have any current complaints.  ED Course: Vital signs in the ED significant for blood pressure in the 110s to 160s systolic.  Lab work-up showed CMP within normal limits, CBC within normal limits, PT, PTT, INR within normal limits.  Respiratory panel for flu and COVID-negative.  Urinalysis and UDS pending.  Ethanol level negative.  CT head and CT C-spine normal.  Without acute abnormalities.  Neurology consulted earlier in the day recommended MRI/MRA and if this is negative discharge home to continue outpatient work-up.  This was attempted including with sedation including Ativan and Haldol but MRI was unable to be obtained after 2 attempts.  Given this patient was considered for admission,  neurology was reconsulted and agreed with MRI evaluation and additional work-up including MRI of the C-spine as well.  Will transfer patient to [facility].  Review of Systems: Difficult to obtain review of systems due to patient's dementia.  As above did not report any current complaints.  Past Medical History:  Diagnosis Date   Arthritis    Hypertension    Schizophrenia (HCC)    Tobacco abuse     History reviewed. No pertinent surgical history.  Social History  reports that she has been smoking cigarettes. She has been smoking an average of 1 pack per day. She has never used smokeless tobacco. She reports that she does not currently use alcohol. She reports that she does not currently use drugs.  No Known Allergies  Family History  Problem Relation Age of Onset   Schizophrenia Mother    Migraines Other    Schizophrenia Son    Schizophrenia Son    Schizophrenia Daughter   Reviewed on admission  Prior to Admission medications   Medication Sig Start Date End Date Taking? Authorizing Provider  amLODipine (NORVASC) 10 MG tablet Take 10 mg by mouth daily.    [provider]  aspirin EC 81 MG tablet Take 81 mg by mouth daily. Swallow whole.    [provider]  docusate sodium (COLACE) 50 MG capsule Take 50 mg by mouth every other day.    [provider]  haloperidol decanoate (HALDOL DECANOATE) 100 MG/ML injection Inject 75 mg into the muscle every 28 (twenty-eight) days. Last injection 03/08/18  [provider]  nicotine (NICODERM CQ - DOSED IN MG/24 HOURS) 21 mg/24hr patch Place 1 patch (21 mg total) onto the skin daily. Patient not taking: Reported on 11/13/2021 03/28/18   Alwyn RenMathews, Elizabeth G, MD  simvastatin (ZOCOR) 5 MG tablet Take 5 mg by mouth daily. 11/02/21   [provider]  traZODone (DESYREL) 50 MG tablet Take 25-50 mg by mouth at bedtime as needed for sleep.    [provider]  trihexyphenidyl (ARTANE) 2 MG  tablet Take 4 mg by mouth daily.    [provider]    Physical Exam: Vitals:   01/18/22 1815 01/18/22 1830 01/18/22 1900 01/18/22 1915  BP: (!) 154/82 (!) 162/82 (!) 131/109 (!) 145/84  Pulse: 73 77 75 73  Resp:  16  20  Temp:      TempSrc:      SpO2: 97% 97% 97% 98%  Weight:      Height:        Physical Exam Constitutional:      General: She is not in acute distress.    Comments: Thin elderly female  HENT:     Head: Normocephalic and atraumatic.     Mouth/Throat:     Mouth: Mucous membranes are moist.     Pharynx: Oropharynx is clear.  Eyes:     Extraocular Movements: Extraocular movements intact.     Pupils: Pupils are equal, round, and reactive to light.  Cardiovascular:     Rate and Rhythm: Normal rate and regular rhythm.     Pulses: Normal pulses.     Heart sounds: Normal heart sounds.  Pulmonary:     Effort: Pulmonary effort is normal. No respiratory distress.     Breath sounds: Normal breath sounds.  Abdominal:     General: Bowel sounds are normal. There is no distension.     Palpations: Abdomen is soft.     Tenderness: There is no abdominal tenderness.  Musculoskeletal:        General: No swelling or deformity.  Skin:    General: Skin is warm and dry.  Neurological:     Comments: Bilateral upper extremity contractures, left worse than right. Does not answer all questions appropriately, difficult to understand at times.   Labs on Admission: I have personally reviewed following labs and imaging studies  CBC: Recent Labs  Lab 01/18/22 1345 01/18/22 1412  WBC 6.3  --   NEUTROABS 3.8  --   HGB 12.5 12.9  HCT 37.6 38.0  MCV 97.4  --   PLT 317  --     Basic Metabolic Panel: Recent Labs  Lab 01/18/22 1345 01/18/22 1412  NA 141 142  K 3.9 4.0  CL 106 105  CO2 28  --   GLUCOSE 94 90  BUN 18 16  CREATININE 0.64 0.50  CALCIUM 9.3  --     GFR: Estimated Creatinine Clearance: 39.5 mL/min (by C-G formula based on SCr of 0.5  mg/dL).  Liver Function Tests: Recent Labs  Lab 01/18/22 1345  AST 25  ALT 25  ALKPHOS 120  BILITOT 0.4  PROT 7.6  ALBUMIN 4.1    Urine analysis:    Component Value Date/Time   COLORURINE YELLOW 03/26/2018 1310   APPEARANCEUR CLEAR 03/26/2018 1310   LABSPEC 1.011 03/26/2018 1310   PHURINE 7.0 03/26/2018 1310   GLUCOSEU NEGATIVE 03/26/2018 1310   HGBUR NEGATIVE 03/26/2018 1310   BILIRUBINUR NEGATIVE 03/26/2018 1310   KETONESUR NEGATIVE 03/26/2018 1310   PROTEINUR NEGATIVE  03/26/2018 1310   NITRITE NEGATIVE 03/26/2018 1310   LEUKOCYTESUR NEGATIVE 03/26/2018 1310    Radiological Exams on Admission: CT HEAD WO CONTRAST  Result Date: 01/18/2022 CLINICAL DATA:  I would want is fall earlier today. EXAM: CT HEAD WITHOUT CONTRAST CT CERVICAL SPINE WITHOUT CONTRAST TECHNIQUE: Multidetector CT imaging of the head and cervical spine was performed following the standard protocol without intravenous contrast. Multiplanar CT image reconstructions of the cervical spine were also generated. RADIATION DOSE REDUCTION: This exam was performed according to the departmental dose-optimization program which includes automated exposure control, adjustment of the mA and/or kV according to patient size and/or use of iterative reconstruction technique. COMPARISON:  11/07/2021 FINDINGS: CT HEAD FINDINGS Brain: There is no evidence for acute hemorrhage, hydrocephalus, mass lesion, or abnormal extra-axial fluid collection. No definite CT evidence for acute infarction. Diffuse loss of parenchymal volume is consistent with atrophy. Patchy low attenuation in the deep hemispheric and periventricular white matter is nonspecific, but likely reflects chronic microvascular ischemic demyelination. Vascular: No hyperdense vessel or unexpected calcification. Skull: No evidence for fracture. No worrisome lytic or sclerotic lesion. Sinuses/Orbits: The visualized paranasal sinuses and mastoid air cells are clear. Visualized  portions of the globes and intraorbital fat are unremarkable. Other: None. CT CERVICAL SPINE FINDINGS Alignment: Mild reversal of normal cervical lordosis without subluxation. Skull base and vertebrae: No acute fracture. No primary bone lesion or focal pathologic process. Soft tissues and spinal canal: No prevertebral fluid or swelling. No visible canal hematoma. Disc levels: Loss of disc height with endplate spurring noted C5-6. Prominent endplate spurring noted anteriorly at C6-7. Advanced facet osteoarthritis noted bilaterally in the upper and mid cervical levels. Upper chest: Unremarkable. Other: None. IMPRESSION: 1. No acute intracranial abnormality. 2. Atrophy with chronic small vessel ischemic disease. 3. No cervical spine fracture or subluxation. 4. Degenerative changes in the cervical spine as above. Electronically Signed   By: Kennith CenterEric  Mansell M.D.   On: 01/18/2022 15:50   CT Cervical Spine Wo Contrast  Result Date: 01/18/2022 CLINICAL DATA:  I would want is fall earlier today. EXAM: CT HEAD WITHOUT CONTRAST CT CERVICAL SPINE WITHOUT CONTRAST TECHNIQUE: Multidetector CT imaging of the head and cervical spine was performed following the standard protocol without intravenous contrast. Multiplanar CT image reconstructions of the cervical spine were also generated. RADIATION DOSE REDUCTION: This exam was performed according to the departmental dose-optimization program which includes automated exposure control, adjustment of the mA and/or kV according to patient size and/or use of iterative reconstruction technique. COMPARISON:  11/07/2021 FINDINGS: CT HEAD FINDINGS Brain: There is no evidence for acute hemorrhage, hydrocephalus, mass lesion, or abnormal extra-axial fluid collection. No definite CT evidence for acute infarction. Diffuse loss of parenchymal volume is consistent with atrophy. Patchy low attenuation in the deep hemispheric and periventricular white matter is nonspecific, but likely reflects  chronic microvascular ischemic demyelination. Vascular: No hyperdense vessel or unexpected calcification. Skull: No evidence for fracture. No worrisome lytic or sclerotic lesion. Sinuses/Orbits: The visualized paranasal sinuses and mastoid air cells are clear. Visualized portions of the globes and intraorbital fat are unremarkable. Other: None. CT CERVICAL SPINE FINDINGS Alignment: Mild reversal of normal cervical lordosis without subluxation. Skull base and vertebrae: No acute fracture. No primary bone lesion or focal pathologic process. Soft tissues and spinal canal: No prevertebral fluid or swelling. No visible canal hematoma. Disc levels: Loss of disc height with endplate spurring noted C5-6. Prominent endplate spurring noted anteriorly at C6-7. Advanced facet osteoarthritis noted bilaterally in the upper and  mid cervical levels. Upper chest: Unremarkable. Other: None. IMPRESSION: 1. No acute intracranial abnormality. 2. Atrophy with chronic small vessel ischemic disease. 3. No cervical spine fracture or subluxation. 4. Degenerative changes in the cervical spine as above. Electronically Signed   By: Kennith Center M.D.   On: 01/18/2022 15:50   MR BRAIN WO CONTRAST  Result Date: 01/18/2022 CLINICAL DATA:  Stroke suspected EXAM: MRI HEAD WITHOUT CONTRAST TECHNIQUE: Multiplanar, multiecho pulse sequences of the brain and surrounding structures were obtained without intravenous contrast. COMPARISON:  11/07/2021. FINDINGS: Evaluation is limited by motion artifact and limited sequences obtained. Brain: No restricted diffusion to suggest acute or subacute infarct. No evidence of acute or remote hemorrhage. Confluent T2 hyperintense signal in the periventricular white matter, likely the sequela of severe chronic small vessel ischemic disease. Degree of cerebral volume loss is likely within normal limits for age. No hydrocephalus or extra-axial collection. Vascular: Evaluation is limited by the absence of the standard  T2 weighted sequence, however on the available sequences, normal flow voids are seen. Loss of the right transverse and sigmoid sinus flow void appears unchanged compared to the prior exam and likely reflects slow flow in the non dominant sinus. Skull and upper cervical spine: Normal on these limited sequences. Sinuses/Orbits: Negative. Other: None. IMPRESSION: Evaluation is significantly limited by motion artifact and abbreviated exam. Within this limitation, no acute or subacute infarct. No evidence of hemorrhage. Electronically Signed   By: Wiliam Ke M.D.   On: 01/18/2022 19:58    EKG: Independently reviewed.  Sinus rhythm at 73 bpm.  Baseline artifact.  Evidence of LVH.  Assessment/Plan Principal Problem:   Ataxia Active Problems:   Schizophrenia (HCC)   Hypertension   Ataxia Fall > Patient presented after a fall.  New diagnosis of ataxia with left-sided contractures being worked up outpatient by neurology.  Now with new developing right-sided contractures. > Initial recommendation was for MRI and discharge if normal, however this was unable to be done despite sedation with Haldol and Ativan.  2 attempts at MRI failed. > Patient will need to be considered for sedation possibly with anesthesia MRI.  This will need to be done at [facility].  Neurology at [facility] reconsulted and agrees with need for imaging.  Patient to be transferred to [facility] for repeat MRI attempt as this is the only location patient could receive general anesthesia if needed. > Work-up outpatient thus far includes checking B12 which was normal, labs recommended but do not see in the system include TSH, vitamin D, lipid panel, A1c, anemia panel. > Other work-up includes previous MRI which was negative in December.  Echo which showed EF 70-75% grade 1 diastolic dysfunction and hyperdynamic heart. > Outpatient recommendations included to remain on aspirin, statin, antihypertensives and abstain from smoking. -  Monitor on telemetry - Admit to [facility] - Appreciate neurology recommendations -We will proceed with laboratory work-up, neurology plans to order this once they have evaluated patient at [facility].  Likely to include anemia panel, thyroid, autoimmune, vitamins. - MRI head and Neck WO, consider MRA versus CTA (if able to get MRI done without general anesthesia, prioritize MRI without of head and neck.), will need coordination with anesthesia if remains unable to be done with IV sedation. - Continue aspirin, statin  Schizophrenia > Patient receives long-acting Haldol injections - Continue home Artane if confirmed by pharmacy  Hypertension - Continue home amlodipine  Hyperlipidemia - Continue home statin   DVT prophylaxis: Lovenox Code  Status:   Full Family Communication:  Daughter updated at bedside. Disposition Plan:   Patient is from:  Home  Anticipated DC to:  Home  Anticipated DC date:  1 to 4 days  Anticipated DC barriers: Depending on ability to get MRI scheduled if requiring sedation.  Consults called:  Neurology, initially consulted by the ED and then reconsulted when considered for transfer to [facility]. Admission status:  Observation, telemetry  Severity of Illness: The appropriate patient status for this patient is OBSERVATION. Observation status is judged to be reasonable and necessary in order to provide the required intensity of service to ensure the patient's safety. The patient's presenting symptoms, physical exam findings, and initial radiographic and laboratory data in the context of their medical condition is felt to place them at decreased risk for further clinical deterioration. Furthermore, it is anticipated that the patient will be medically stable for discharge from the hospital within 2 midnights of admission.    Synetta Fail MD [facility]  How to contact the [facility] Attending or Consulting provider 7A - 7P or covering provider during after  hours 7P -7A, for this patient?   Check the care team in [facility] and look for a) attending/consulting [facility] provider listed and b) the [facility] team listed Log into [email or web address] and use [place]'s universal password to access. If you do not have the password, please contact the hospital operator. Locate the [facility] provider you are looking for under [facility] and page to a number that you can be directly reached. If you still have difficulty reaching the provider, please page the [facility] (Director on Call) for the Hospitalists listed on amion for assistance.  01/18/2022, 8:16 PM

## 2022-01-18 NOTE — Progress Notes (Signed)
3rd attempt to scan patient tonight. Patient received more medication prior, but could not complete the entire set of images. Patient wakes up during scan confused and unable to hold still. Only a few brain sequences obtained and motion is seen throughout. Best possible at this time. [place] Flood

## 2022-01-18 NOTE — Progress Notes (Signed)
2nd attempt at MRI tonight. Patient was medicated heavily. Patient unable to begin exam, no images acquired. Patient became combative and attempted to remove brain coils. Concern for patient safety caused staff to halt exam and return patient to ED.  [place]

## 2022-01-18 NOTE — ED Provider Notes (Signed)
Patient signed to me pending results of MRI angio of head neck.  Patient patient given Ativan and Haldol but still unable to have exam performed.  Patient has worsening ataxia as well as had a fall today.  Fall was unwitnessed and this could have been syncope.  Given that work-up has not been able to be completed here.  Patient will require admission for IV sedated MRI to rule out central process.   Lorre Nick, MD 01/18/22 [phone]

## 2022-01-18 NOTE — ED Notes (Signed)
Pure wick placed.

## 2022-01-18 NOTE — ED Notes (Signed)
Tried to get blood work but did not. Pt  has tiny veins and could not straighten her arm and was a little fidgety.

## 2022-01-18 NOTE — ED Notes (Signed)
Patient transported to MRI 

## 2022-01-18 NOTE — ED Provider Triage Note (Signed)
Emergency Medicine Provider Triage Evaluation Note  Janice Castro , a 86 y.o. female  was evaluated in triage.  Pt complains of fall. The patient had an unwitnessed fall this am. Unclear LOC. Patient exhibiting signs of dementia but has not been diagnosed. She has contractures at baseline in her LUE. No known CVA hx. Saw neuro in Dec and diagnosed with L arm contracture and ataxia of unlcear etiology. Daughter worried about right hand becoming contracted now as well.  Review of Systems  Positive: Contracture noted, cough Negative: Fever, chills  Physical Exam  BP 112/86 (BP Location: Right Arm)    Pulse 73    Temp 98.2 F (36.8 C) (Oral)    Resp 18    Ht 5\' 4"  (1.626 m)    Wt 47 kg    SpO2 98%    BMI 17.79 kg/m  Gen:   Awake, no distress  At baseline Resp:  Normal effort, no wheezing MSK/Neuro   Moves RUE and bilat Les, LUE contracted  Medical Decision Making  Medically screening exam initiated at 11:36 AM.  Appropriate orders placed.  was informed that the remainder of the evaluation will be completed by another provider, this initial triage assessment does not replace that evaluation, and the importance of remaining in the ED until their evaluation is complete.  Will initiate CVA workup with CT head and labs. Patient will need MRA head and neck per recent outpatient neuro note.   Francesca Jewett, MD 01/18/22 [phone]

## 2022-01-18 NOTE — ED Provider Notes (Addendum)
[facility]-EMERGENCY DEPT Provider Note   CSN: [id number] Arrival date & time: 01/18/22  1035     History  No chief complaint on file.   Janice Castro is a 86 y.o. female.  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  The patient had an unwitnessed fall per her daughter who presents bedside.  She had unknown loss of consciousness.  She has been at her baseline mental status per her daughter.  She has contractures in her left upper extremity at baseline and is now developing contractures in her right upper extremity.  No known history of CVA.  She did see neurology and this member and recommendations had included MRA of the head to evaluate for possible vascular etiology but this has not yet been done.  She did have an echocardiogram that was ordered by cardiology done on 12/31/2021 which revealed an LVEF of 70 to 75% with hyperdynamic left ventricular function, no regional wall motion abnormalities, grade 1 diastolic dysfunction.  Home Medications Prior to Admission medications   Medication Sig Start Date End Date Taking? Authorizing Provider  amLODipine (NORVASC) 10 MG tablet Take 10 mg by mouth daily.    [provider]  aspirin EC 81 MG tablet Take 81 mg by mouth daily. Swallow whole.    [provider]  docusate sodium (COLACE) 50 MG capsule Take 50 mg by mouth every other day.    [provider]  haloperidol decanoate (HALDOL DECANOATE) 100 MG/ML injection Inject 75 mg into the muscle every 28 (twenty-eight) days. Last injection 03/08/18    [provider]  nicotine (NICODERM CQ - DOSED IN MG/24 HOURS) 21 mg/24hr patch Place 1 patch (21  mg total) onto the skin daily. Patient not taking: Reported on 11/13/2021 03/28/18   Alwyn Ren, MD  simvastatin (ZOCOR) 5 MG tablet Take 5 mg by mouth daily. 11/02/21   [provider]  traZODone (DESYREL) 50 MG tablet Take 25-50 mg by mouth at bedtime as needed for sleep.    [provider]  trihexyphenidyl (ARTANE) 2 MG tablet Take 4 mg by mouth daily.    [provider]      Allergies    Patient has no known allergies.    Review of Systems   Review of Systems  Unable to perform ROS: Dementia   Physical Exam Updated Vital Signs BP (!) 162/82    Pulse 77    Temp 98.2 F (36.8 C) (Oral)    Resp 16    Ht 5\' 4"  (1.626 m)    Wt 47 kg    SpO2 97%    BMI 17.79 kg/m  Physical Exam Vitals and nursing note reviewed.  Constitutional:      General: She is not in acute distress.    Appearance: She is well-developed.     Comments: GCS 15, ABC intact  HENT:     Head: Normocephalic and atraumatic.  Eyes:     Extraocular Movements: Extraocular movements intact.     Conjunctiva/sclera: Conjunctivae normal.     Pupils: Pupils are equal, round, and reactive to light.  Neck:     Comments: No midline tenderness to palpation of the cervical spine.  Range of motion intact Cardiovascular:     Rate  and Rhythm: Normal rate and regular rhythm.     Heart sounds: No murmur heard. Pulmonary:     Effort: Pulmonary effort is normal. No respiratory distress.     Breath sounds: Normal breath sounds.  Chest:     Comments: Clavicles stable nontender to AP compression.  Chest wall stable and nontender to AP and lateral compression. Abdominal:     Palpations: Abdomen is soft.     Tenderness: There is no abdominal tenderness.  Musculoskeletal:        General: No swelling.     Cervical back: Neck supple.     Comments: No midline tenderness to palpation of the thoracic or lumbar spine.  Extremities atraumatic with intact range of motion  Skin:    General: Skin is warm and  dry.     Capillary Refill: Capillary refill takes less than 2 seconds.  Neurological:     Mental Status: She is alert.     Comments: MENTAL STATUS EXAM:    Orientation: Alert and oriented to person.  CRANIAL NERVES:    CN 2 (Optic): Visual fields intact to confrontation.  CN 3,4,6 (EOM): Pupils equal and reactive to light. Full extraocular eye movement without nystagmus.  CN 5 (Trigeminal): Facial sensation is normal, no weakness of masticatory muscles.  CN 7 (Facial): No facial weakness or asymmetry.  CN 8 (Auditory): Auditory acuity grossly normal.  CN 9,10 (Glossophar): The uvula is midline, the palate elevates symmetrically.  CN 11 (spinal access): Normal sternocleidomastoid and trapezius strength.  CN 12 (Hypoglossal): The tongue is midline. No atrophy or fasciculations.Marland Kitchen   MOTOR:  Muscle Strength: 5/5RUE, 3/5 LUE, contracted at baseline, 5/5RLE, 5/5LLE.   COORDINATION:   Intact finger-to-nose, no tremor.   SENSATION:   Intact to light touch all four extremities.  GAIT: Gait not assessed   Psychiatric:        Mood and Affect: Mood normal.    ED Results / Procedures / Treatments   Labs (all labs ordered are listed, but only abnormal results are displayed) Labs Reviewed  CBC - Abnormal; Notable for the following components:      Result Value   RBC 3.86 (*)    All other components within normal limits  RESP PANEL BY RT-PCR (FLU A&B, COVID) ARPGX2  COMPREHENSIVE METABOLIC PANEL  ETHANOL  PROTIME-INR  APTT  DIFFERENTIAL  URINALYSIS, ROUTINE W REFLEX MICROSCOPIC  RAPID URINE DRUG SCREEN, HOSP PERFORMED  I-STAT CHEM 8, ED    EKG EKG Interpretation  Date/Time:  Monday January 18 2022 11:58:33 EST Ventricular Rate:  73 PR Interval:  116 QRS Duration: 179 QT Interval:  387 QTC Calculation: 427 R Axis:   14 Text Interpretation: Sinus rhythm Borderline short PR interval LVH with secondary repolarization abnormality Anterior Q waves, possibly due to LVH Confirmed by  Ernie Avena ([id number]) on 01/18/2022 2:01:26 PM  Radiology CT HEAD WO CONTRAST  Result Date: 01/18/2022 CLINICAL DATA:  I would want is fall earlier today. EXAM: CT HEAD WITHOUT CONTRAST CT CERVICAL SPINE WITHOUT CONTRAST TECHNIQUE: Multidetector CT imaging of the head and cervical spine was performed following the standard protocol without intravenous contrast. Multiplanar CT image reconstructions of the cervical spine were also generated. RADIATION DOSE REDUCTION: This exam was performed according to the departmental dose-optimization program which includes automated exposure control, adjustment of the mA and/or kV according to patient size and/or use of iterative reconstruction technique. COMPARISON:  11/07/2021 FINDINGS: CT HEAD FINDINGS Brain: There is no evidence for acute hemorrhage, hydrocephalus, mass  lesion, or abnormal extra-axial fluid collection. No definite CT evidence for acute infarction. Diffuse loss of parenchymal volume is consistent with atrophy. Patchy low attenuation in the deep hemispheric and periventricular white matter is nonspecific, but likely reflects chronic microvascular ischemic demyelination. Vascular: No hyperdense vessel or unexpected calcification. Skull: No evidence for fracture. No worrisome lytic or sclerotic lesion. Sinuses/Orbits: The visualized paranasal sinuses and mastoid air cells are clear. Visualized portions of the globes and intraorbital fat are unremarkable. Other: None. CT CERVICAL SPINE FINDINGS Alignment: Mild reversal of normal cervical lordosis without subluxation. Skull base and vertebrae: No acute fracture. No primary bone lesion or focal pathologic process. Soft tissues and spinal canal: No prevertebral fluid or swelling. No visible canal hematoma. Disc levels: Loss of disc height with endplate spurring noted C5-6. Prominent endplate spurring noted anteriorly at C6-7. Advanced facet osteoarthritis noted bilaterally in the upper and mid cervical levels.  Upper chest: Unremarkable. Other: None. IMPRESSION: 1. No acute intracranial abnormality. 2. Atrophy with chronic small vessel ischemic disease. 3. No cervical spine fracture or subluxation. 4. Degenerative changes in the cervical spine as above. Electronically Signed   By: Kennith Center M.D.   On: 01/18/2022 15:50   CT Cervical Spine Wo Contrast  Result Date: 01/18/2022 CLINICAL DATA:  I would want is fall earlier today. EXAM: CT HEAD WITHOUT CONTRAST CT CERVICAL SPINE WITHOUT CONTRAST TECHNIQUE: Multidetector CT imaging of the head and cervical spine was performed following the standard protocol without intravenous contrast. Multiplanar CT image reconstructions of the cervical spine were also generated. RADIATION DOSE REDUCTION: This exam was performed according to the departmental dose-optimization program which includes automated exposure control, adjustment of the mA and/or kV according to patient size and/or use of iterative reconstruction technique. COMPARISON:  11/07/2021 FINDINGS: CT HEAD FINDINGS Brain: There is no evidence for acute hemorrhage, hydrocephalus, mass lesion, or abnormal extra-axial fluid collection. No definite CT evidence for acute infarction. Diffuse loss of parenchymal volume is consistent with atrophy. Patchy low attenuation in the deep hemispheric and periventricular white matter is nonspecific, but likely reflects chronic microvascular ischemic demyelination. Vascular: No hyperdense vessel or unexpected calcification. Skull: No evidence for fracture. No worrisome lytic or sclerotic lesion. Sinuses/Orbits: The visualized paranasal sinuses and mastoid air cells are clear. Visualized portions of the globes and intraorbital fat are unremarkable. Other: None. CT CERVICAL SPINE FINDINGS Alignment: Mild reversal of normal cervical lordosis without subluxation. Skull base and vertebrae: No acute fracture. No primary bone lesion or focal pathologic process. Soft tissues and spinal canal: No  prevertebral fluid or swelling. No visible canal hematoma. Disc levels: Loss of disc height with endplate spurring noted C5-6. Prominent endplate spurring noted anteriorly at C6-7. Advanced facet osteoarthritis noted bilaterally in the upper and mid cervical levels. Upper chest: Unremarkable. Other: None. IMPRESSION: 1. No acute intracranial abnormality. 2. Atrophy with chronic small vessel ischemic disease. 3. No cervical spine fracture or subluxation. 4. Degenerative changes in the cervical spine as above. Electronically Signed   By: Kennith Center M.D.   On: 01/18/2022 15:50    Procedures Procedures    Medications Ordered in ED Medications  LORazepam (ATIVAN) tablet 0.5 mg (has no administration in time range)  LORazepam (ATIVAN) injection 1 mg (has no administration in time range)  LORazepam (ATIVAN) injection 1 mg (1 mg Intravenous Given 01/18/22 1614)  haloperidol lactate (HALDOL) injection 5 mg (5 mg Intravenous Given 01/18/22 1707)  gadobutrol (GADAVIST) 1 MMOL/ML injection 5 mL (5 mLs Intravenous Contrast Given 01/18/22 1749)  ED Course/ Medical Decision Making/ A&P                           Medical Decision Making Amount and/or Complexity of Data Reviewed Labs: ordered. Radiology: ordered.  Risk Prescription drug management.   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  The patient had an unwitnessed fall per her daughter who presents bedside.  She had unknown loss of consciousness.  She has been at her baseline mental status per her daughter.  She has contractures in her left upper extremity at baseline and is now developing contractures in her right upper extremity.  No known history of  CVA.  She did see neurology and this member and recommendations had included MRA of the head to evaluate for possible vascular etiology but this has not yet been done.  She did have an echocardiogram that was ordered by cardiology done on 12/31/2021 which revealed an LVEF of 70 to 75% with hyperdynamic left ventricular function, no regional wall motion abnormalities, grade 1 diastolic dysfunction.  On arrival, the patient was afebrile, hemodynamically stable, saturating well on room air.  Normal sinus rhythm noted on cardiac telemetry.  The patient presents with worsening difficulty with ambulation with persistent ataxia, worsening left upper extremity contracture with concern for developing right upper extremity contracture.  These are of unclear etiology per neurology notes and evaluation in December 2022.  The patient also has a diagnosis of likely dementia given poor performance on Mini-Mental status examination with neurology in clinic.  Her score was 8 out of 30.  CT head and CT cervical spine ordered given the patient's reported fall with no acute intracranial abnormality, no evidence of fracture or malalignment in the cervical spine.  The patient had an EKG performed which revealed sinus rhythm, borderline short PR interval but no significant cardiac conduction abnormalities with no ST segment changes noted.  Laboratory work-up to include a CBC, ethanol level, PT/INR, CMP, COVID-19 and influenza PCR testing was collected and ultimately was unremarkable.  A urinalysis and UDS was ordered and pending.  I spoke with Dr. Viviann SparePalikh of neurology who recommended evaluating further with MRI of the brain.  Given her recent work-up outpatient, he did not feel that further evaluation and management in the hospital would be warranted if MRI imaging is negative.  Plan at time of signout to follow-up with the patient's MRI imaging.  If negative, ambulate the patient to assess for ability to continue outpatient  management.  Signout given to Dr. Freida BusmanAllen at [phone].   Final Clinical Impression(s) / ED Diagnoses Final diagnoses:  None    Rx / DC Orders ED Discharge Orders     None         Ernie AvenaLawsing, Zyia Kaneko, MD 01/18/22 Andres [id number]    Ernie AvenaLawsing, Lorriann Hansmann, MD 01/18/22 1851

## 2022-01-18 NOTE — ED Triage Notes (Signed)
[name], would like to get her re-checked as she is having trouble walking, per daughter pt fell today and 2 weeks ago. Has been getting progressively weaker and having trouble ambulating despite neurology and specialty follow-ups.

## 2022-01-19 ENCOUNTER — Observation Stay (HOSPITAL_COMMUNITY): Payer: Medicare Other | Admitting: Anesthesiology

## 2022-01-19 ENCOUNTER — Observation Stay (HOSPITAL_COMMUNITY)
Admit: 2022-01-19 | Discharge: 2022-01-19 | Disposition: A | Discharge status: Home / Self Care | Payer: Medicare Other | Attending: Internal Medicine | Admitting: Internal Medicine

## 2022-01-19 ENCOUNTER — Encounter (HOSPITAL_COMMUNITY): Payer: Self-pay | Admitting: Anesthesiology

## 2022-01-19 ENCOUNTER — Encounter (HOSPITAL_COMMUNITY): Payer: Self-pay | Admitting: Internal Medicine

## 2022-01-19 ENCOUNTER — Encounter (HOSPITAL_COMMUNITY)
Admission: EM | Disposition: A | Discharge status: Hospice | Payer: Self-pay | Source: Home / Self Care | Attending: Internal Medicine

## 2022-01-19 DIAGNOSIS — E785 Hyperlipidemia, unspecified: Secondary | ICD-10-CM | POA: Diagnosis present

## 2022-01-19 DIAGNOSIS — Z681 Body mass index (BMI) 19 or less, adult: Secondary | ICD-10-CM | POA: Diagnosis not present

## 2022-01-19 DIAGNOSIS — R296 Repeated falls: Secondary | ICD-10-CM | POA: Diagnosis present

## 2022-01-19 DIAGNOSIS — I10 Essential (primary) hypertension: Secondary | ICD-10-CM

## 2022-01-19 DIAGNOSIS — M62422 Contracture of muscle, left upper arm: Secondary | ICD-10-CM | POA: Diagnosis present

## 2022-01-19 DIAGNOSIS — M4802 Spinal stenosis, cervical region: Secondary | ICD-10-CM | POA: Diagnosis present

## 2022-01-19 DIAGNOSIS — R636 Underweight: Secondary | ICD-10-CM | POA: Diagnosis present

## 2022-01-19 DIAGNOSIS — M40202 Unspecified kyphosis, cervical region: Secondary | ICD-10-CM

## 2022-01-19 DIAGNOSIS — F209 Schizophrenia, unspecified: Secondary | ICD-10-CM | POA: Diagnosis present

## 2022-01-19 DIAGNOSIS — M5010 Cervical disc disorder with radiculopathy, unspecified cervical region: Secondary | ICD-10-CM

## 2022-01-19 DIAGNOSIS — W19XXXA Unspecified fall, initial encounter: Secondary | ICD-10-CM | POA: Diagnosis not present

## 2022-01-19 DIAGNOSIS — F0390 Unspecified dementia without behavioral disturbance: Secondary | ICD-10-CM | POA: Diagnosis present

## 2022-01-19 DIAGNOSIS — F0283 Dementia in other diseases classified elsewhere, unspecified severity, with mood disturbance: Secondary | ICD-10-CM | POA: Diagnosis present

## 2022-01-19 DIAGNOSIS — Z818 Family history of other mental and behavioral disorders: Secondary | ICD-10-CM | POA: Diagnosis not present

## 2022-01-19 DIAGNOSIS — G309 Alzheimer's disease, unspecified: Secondary | ICD-10-CM | POA: Diagnosis present

## 2022-01-19 DIAGNOSIS — Z79899 Other long term (current) drug therapy: Secondary | ICD-10-CM | POA: Diagnosis not present

## 2022-01-19 DIAGNOSIS — Z7982 Long term (current) use of aspirin: Secondary | ICD-10-CM | POA: Diagnosis not present

## 2022-01-19 DIAGNOSIS — M4712 Other spondylosis with myelopathy, cervical region: Secondary | ICD-10-CM | POA: Diagnosis present

## 2022-01-19 DIAGNOSIS — F17210 Nicotine dependence, cigarettes, uncomplicated: Secondary | ICD-10-CM | POA: Diagnosis present

## 2022-01-19 DIAGNOSIS — F02818 Dementia in other diseases classified elsewhere, unspecified severity, with other behavioral disturbance: Secondary | ICD-10-CM | POA: Diagnosis present

## 2022-01-19 DIAGNOSIS — Z20822 Contact with and (suspected) exposure to covid-19: Secondary | ICD-10-CM | POA: Diagnosis present

## 2022-01-19 DIAGNOSIS — R270 Ataxia, unspecified: Secondary | ICD-10-CM | POA: Diagnosis present

## 2022-01-19 HISTORY — PX: RADIOLOGY WITH ANESTHESIA: SHX6223

## 2022-01-19 LAB — URINALYSIS, ROUTINE W REFLEX MICROSCOPIC
Bacteria, UA: NONE SEEN
Bilirubin Urine: NEGATIVE
Glucose, UA: NEGATIVE mg/dL
Ketones, ur: NEGATIVE mg/dL
Leukocytes,Ua: NEGATIVE
Nitrite: NEGATIVE
Protein, ur: 100 mg/dL — AB
Specific Gravity, Urine: 1.024 (ref 1.005–1.030)
pH: 5 (ref 5.0–8.0)

## 2022-01-19 LAB — CBC
HCT: 36 % (ref 36.0–46.0)
Hemoglobin: 12.1 g/dL (ref 12.0–15.0)
MCH: 32.6 pg (ref 26.0–34.0)
MCHC: 33.6 g/dL (ref 30.0–36.0)
MCV: 97 fL (ref 80.0–100.0)
Platelets: 309 10*3/uL (ref 150–400)
RBC: 3.71 MIL/uL — ABNORMAL LOW (ref 3.87–5.11)
RDW: 13.2 % (ref 11.5–15.5)
WBC: 7.4 10*3/uL (ref 4.0–10.5)
nRBC: 0 % (ref 0.0–0.2)

## 2022-01-19 LAB — RAPID URINE DRUG SCREEN, HOSP PERFORMED
Amphetamines: NOT DETECTED
Barbiturates: NOT DETECTED
Benzodiazepines: NOT DETECTED
Cocaine: NOT DETECTED
Opiates: NOT DETECTED
Tetrahydrocannabinol: NOT DETECTED

## 2022-01-19 LAB — BASIC METABOLIC PANEL
Anion gap: 8 (ref 5–15)
BUN: 16 mg/dL (ref 8–23)
CO2: 25 mmol/L (ref 22–32)
Calcium: 9.1 mg/dL (ref 8.9–10.3)
Chloride: 108 mmol/L (ref 98–111)
Creatinine, Ser: 0.74 mg/dL (ref 0.44–1.00)
GFR, Estimated: >60 mL/min (ref >60)
Glucose, Bld: 109 mg/dL — ABNORMAL HIGH (ref 70–99)
Potassium: 4.1 mmol/L (ref 3.5–5.1)
Sodium: 141 mmol/L (ref 135–145)

## 2022-01-19 LAB — FOLATE: Folate: 10.9 ng/mL (ref >5.9)

## 2022-01-19 LAB — SEDIMENTATION RATE: Sed Rate: 30 mm/hr — ABNORMAL HIGH (ref 0–22)

## 2022-01-19 LAB — TSH: TSH: 1.782 u[IU]/mL (ref 0.350–4.500)

## 2022-01-19 IMAGING — MR MR MRA NECK WO/W CM
2 of 3 series · 16 of 48 positions shown · IV contrast (Yes GAD)
Comparison: Brain MRI [DATE].  Brain MRI [DATE].

CLINICAL DATA: Neuro deficit, acute, stroke suspected. Stroke/TIA,
determine embolic source. Fall. Left upper extremity contracture.
Ataxia.

EXAM:
MRI HEAD WITHOUT AND WITH CONTRAST
MRA HEAD WITHOUT CONTRAST
MRA NECK WITHOUT AND WITH CONTRAST
TECHNIQUE: Multiplanar, multi-echo pulse sequences of the brain and surrounding
structures were acquired without and with intravenous contrast.
Angiographic images of the Circle of Willis were acquired using MRA
technique without intravenous contrast. Angiographic images of the
neck were acquired using MRA technique without and with intravenous
contrast. Carotid stenosis measurements (when applicable) are
obtained utilizing NASCET criteria, using the distal internal
carotid diameter as the denominator.
CONTRAST:  5mL GADAVIST GADOBUTROL 1 MMOL/ML IV SOLN, 4mL GADAVIST
GADOBUTROL 1 MMOL/ML IV SOLN

[((date))-((date)) · coronal · 1.2mm · 0.59mm/px · 8 of 105 slices shown (1 of 2)]
[im 1/105]
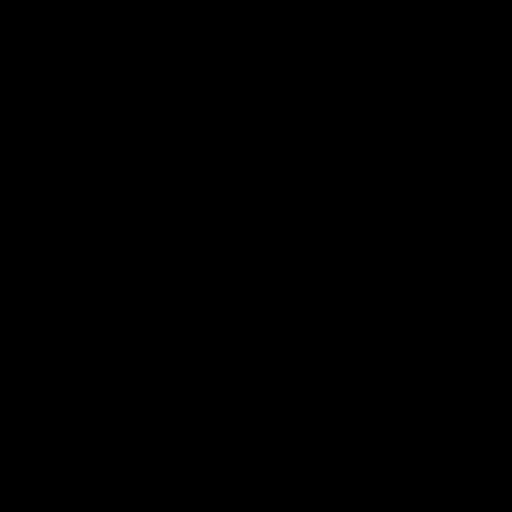
[im 17/105]
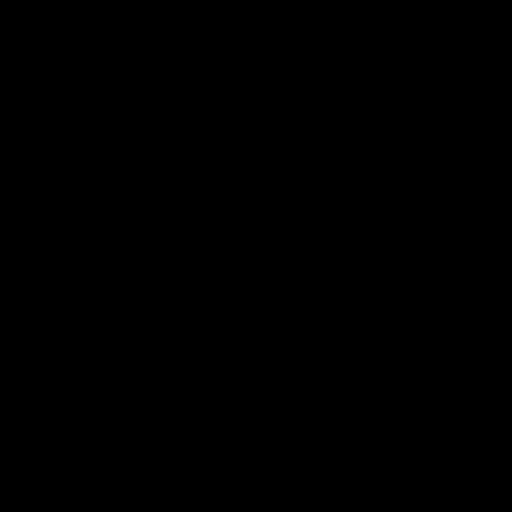
[im 33/105]
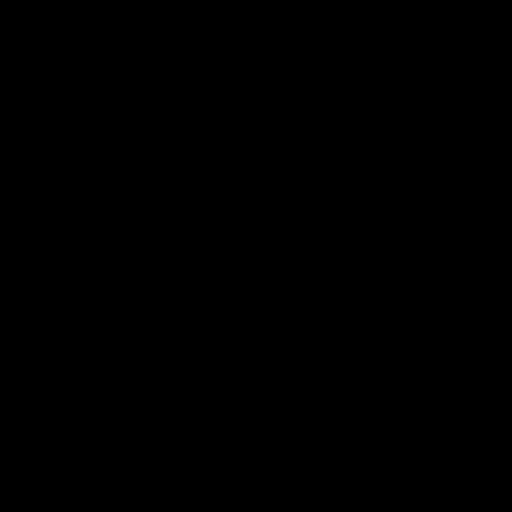
[im 49/105]
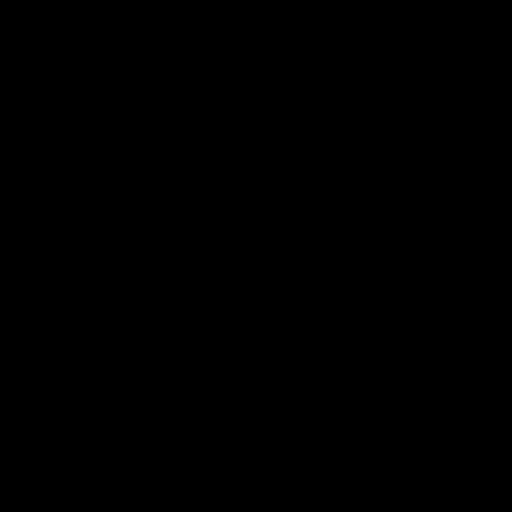
[im 57/105]
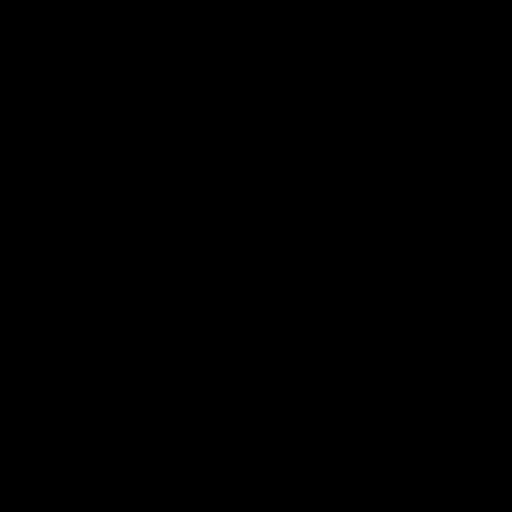
[im 73/105]
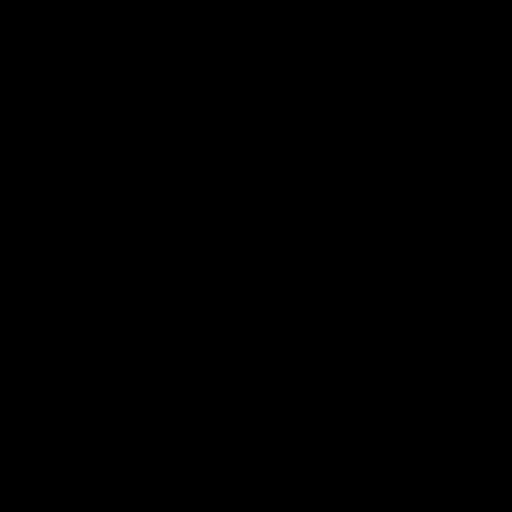
[im 89/105]
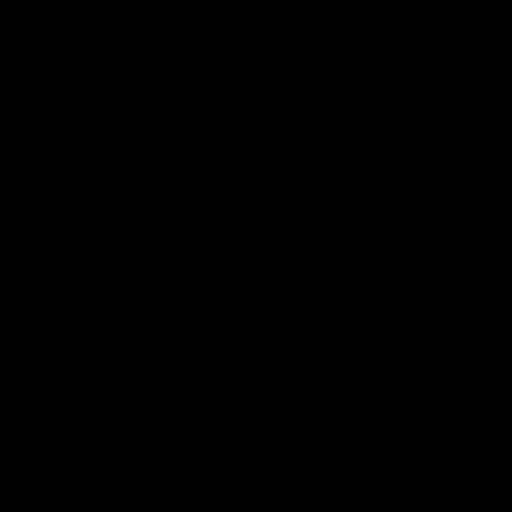
[im 105/105]
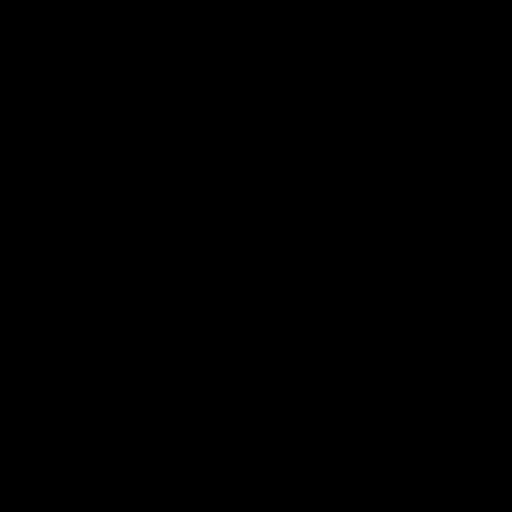

[((date))-((date)) · coronal · 1.2mm · 0.59mm/px · 8 of 105 slices shown (2 of 2)]
[im 1/105]
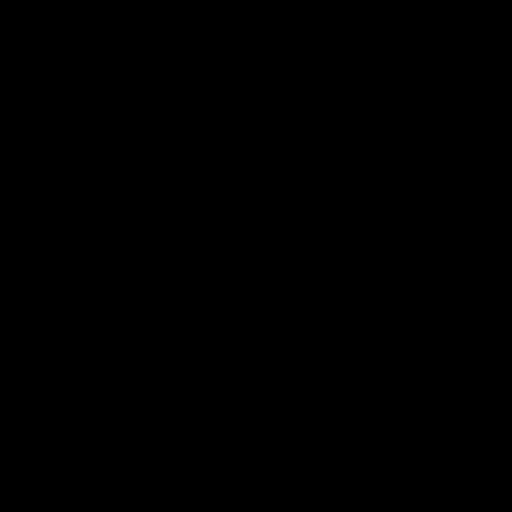
[im 17/105]
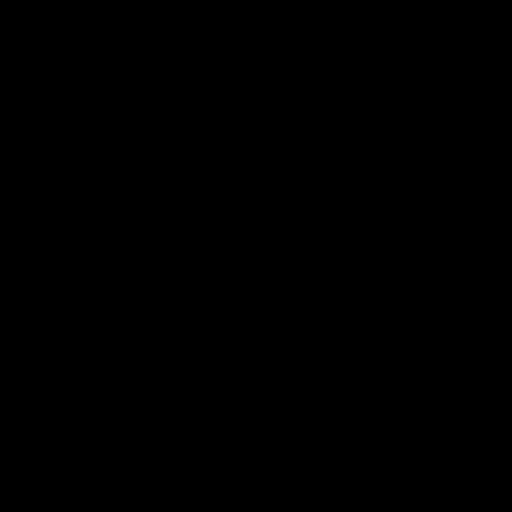
[im 33/105]
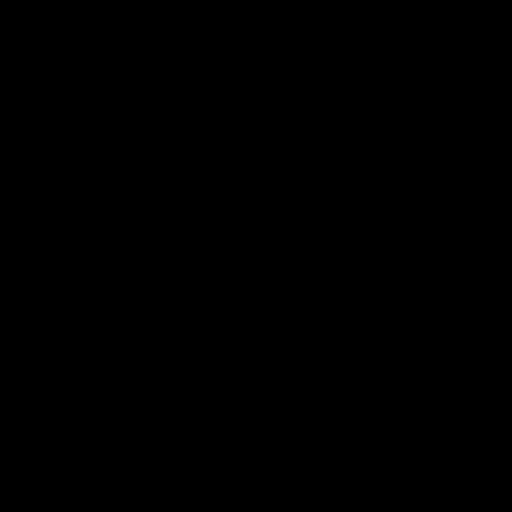
[im 49/105]
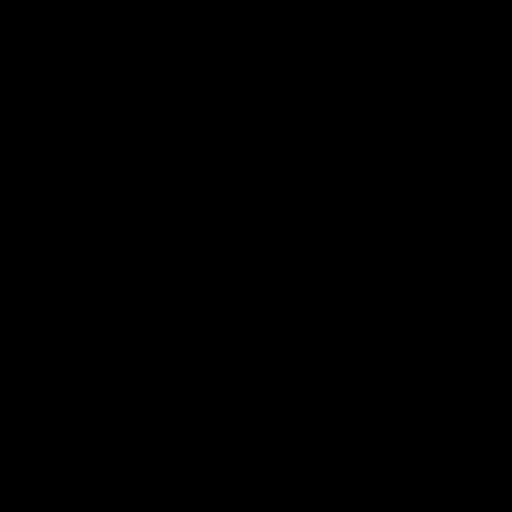
[im 57/105]
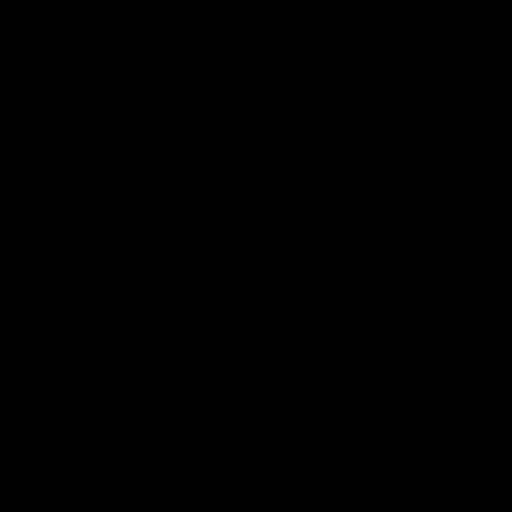
[im 73/105]
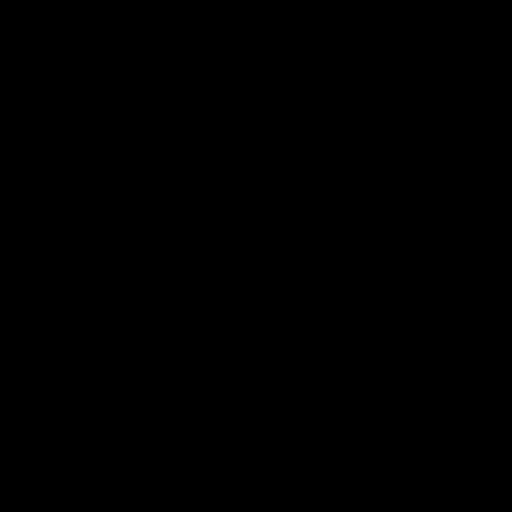
[im 89/105]
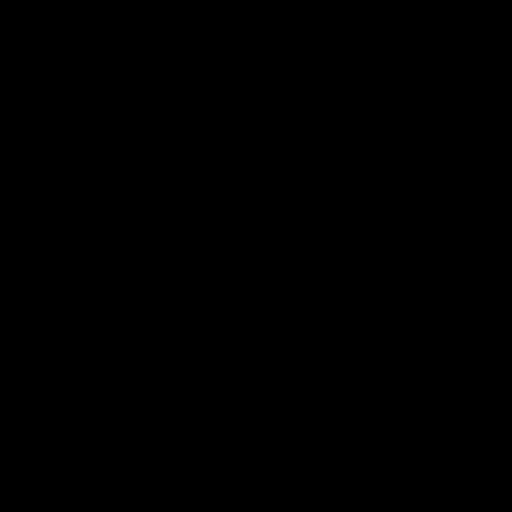
[im 105/105]
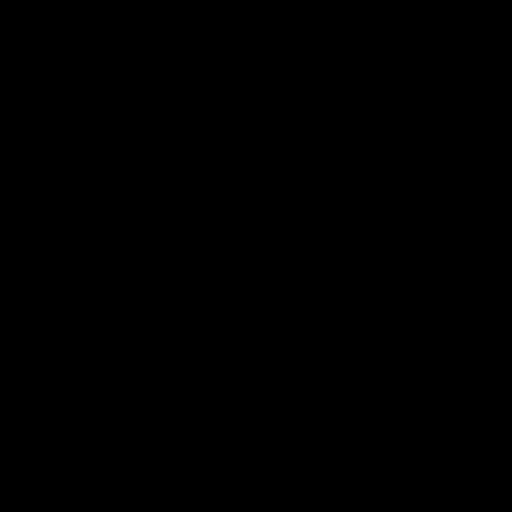

[16 of 48 positions shown; findings below may reference images not displayed]

FINDINGS: MRI HEAD FINDINGS

Brain:

Intermittently motion degraded examination. Most notably, there is
mild-to-moderate motion degradation of the axial T1 weighted
post-contrast sequence.

Mild-to-moderate generalized cerebral atrophy. Comparatively mild
cerebellar atrophy.

Moderate patchy and confluent T2 FLAIR hyperintense signal
abnormality within the cerebral white matter and pons, nonspecific
but compatible with chronic small vessel ischemic disease.

Nonspecific punctate chronic parenchymal microhemorrhage within the
mid left frontal lobe (series 8, image 65).

There is no acute infarct.

No evidence of an intracranial mass.

No extra-axial fluid collection.

No midline shift.

No pathologic intracranial enhancement identified.

Vascular: Maintained flow voids within the proximal large arterial
vessels.

Skull and upper cervical spine: No focal suspicious marrow lesion.
Incompletely assessed cervical spondylosis.

Sinuses/Orbits: Visualized orbits show no acute finding. Minimal
mucosal thickening within the bilateral maxillary sinuses.

Other: Nonspecific 2.2 cm cystic appearing subcutaneous lesion along
the inferolateral aspect of the left maxilla (for instance as seen
on series 24, image 24) (series 22, image 24) (series 5, image 20).

MRA HEAD FINDINGS

Mildly motion degraded exam.

Anterior circulation:

The intracranial internal carotid arteries are patent. Mild
atherosclerotic irregularity of both vessels. The M1 middle cerebral
arteries are patent. No M2 proximal branch occlusion or high-grade
proximal stenosis is identified. The anterior cerebral arteries are
patent. Hypoplastic left A1 segment. No intracranial aneurysm is
identified.

Posterior circulation:

The intracranial vertebral arteries are patent. The basilar artery
is patent. The posterior cerebral arteries are patent. Mild
atherosclerotic irregularity and narrowing of the P2 posterior
cerebral arteries, bilaterally. Posterior communicating arteries are
developmentally diminutive or absent, bilaterally.

Anatomic variants: As described.

MRA NECK FINDINGS

Aortic arch: The origin of the innominate and left common carotid
arteries. The visualized aortic arch is normal in caliber. No
appreciable hemodynamically significant innominate or proximal
subclavian artery stenosis.

Right carotid system: CCA and ICA patent within the neck without
hemodynamically significant stenosis (50% or greater). Mild
atherosclerotic plaque about the carotid bifurcation and within the
proximal ICA.

Left carotid system: CCA and ICA patent within the neck. Moderate
atherosclerotic plaque about the carotid bifurcation and within the
proximal ICA. Up to 40% stenosis of the proximal ICA.

Vertebral arteries: Codominant and patent within the neck without
significant stenosis.
IMPRESSION: MRI brain:

1. Intermittently motion degraded exam.
2. No evidence of acute intracranial abnormality.
3. Moderate chronic small vessel ischemic changes within the
cerebral white matter and pons.
4. Mild-to-moderate generalized cerebral atrophy.
5. Comparatively mild cerebellar atrophy.
6. Nonspecific 2.2 cm cystic-appearing subcutaneous lesion along the
inferolateral aspect of the right maxilla. Consider a non-emergent
contrast-enhanced maxillofacial CT for further evaluation.

MRA head:

1. No intracranial large vessel occlusion or proximal high-grade
arterial stenosis identified.
2. Mild atherosclerotic irregularity of the intracranial internal
carotid arteries.
3. Mild atherosclerotic irregularity and narrowing of the P2
posterior cerebral arteries, bilaterally.

MRA neck:

1. The common carotid and internal carotid arteries are patent
within the neck. Up to 40% atherosclerotic stenosis within the
proximal cervical left ICA. Mild atherosclerotic plaque about the
right carotid bifurcation and within the proximal ICA, without
significant stenosis at these sites.
2. Vertebral arteries patent within the neck without significant
stenosis.

## 2022-01-19 IMAGING — MR MR CERVICAL SPINE WO/W CM
4 of 8 series · 18 of 48 positions shown · IV contrast (gadavist)
Comparison: CT cervical spine [DATE]

CLINICAL DATA: Cervical radiculopathy.  Fall at home.

EXAM:
MRI CERVICAL SPINE WITHOUT AND WITH CONTRAST
TECHNIQUE: Multiplanar and multiecho pulse sequences of the cervical spine, to
include the craniocervical junction and cervicothoracic junction,
were obtained without and with intravenous contrast.
CONTRAST:  5mL GADAVIST GADOBUTROL 1 MMOL/ML IV SOLN, 4mL GADAVIST
GADOBUTROL 1 MMOL/ML IV SOLN

[Series 12: T2 · sagittal · 3.0mm · 0.43mm/px · 4 of 16 slices shown (1 of 2)]
[im 1/16]
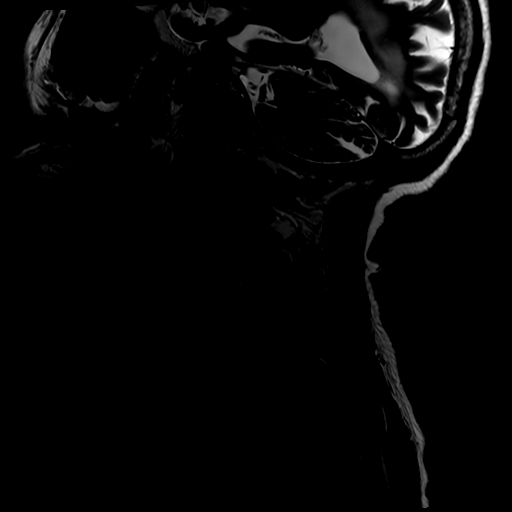
[im 6/16]
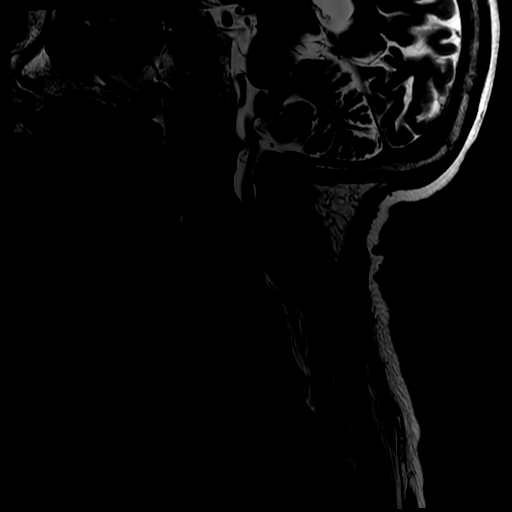
[im 11/16]
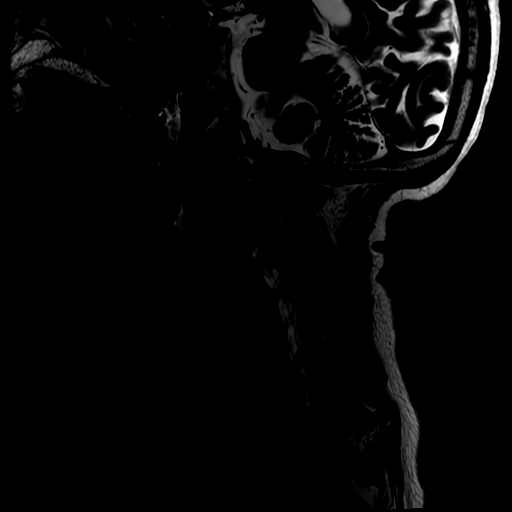
[im 16/16]
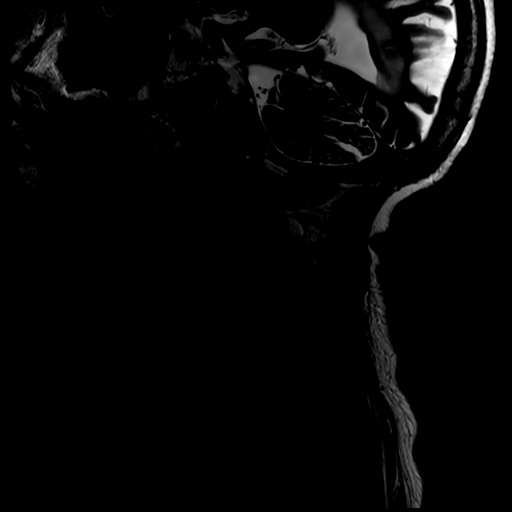

[Series 16: T2 · oblique · 3.0mm · 0.35mm/px · 6 of 28 slices shown (2 of 2)]
[im 1/28]
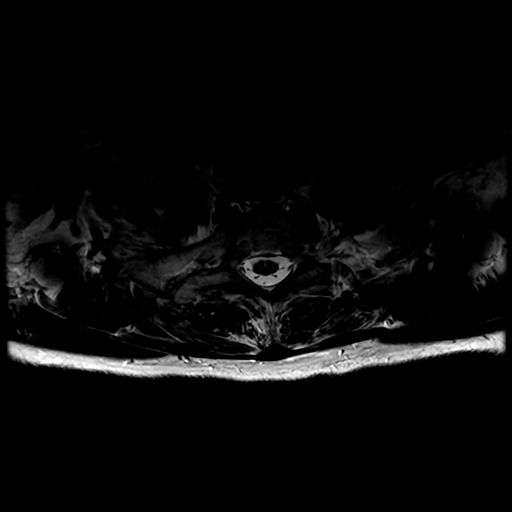
[im 6/28]
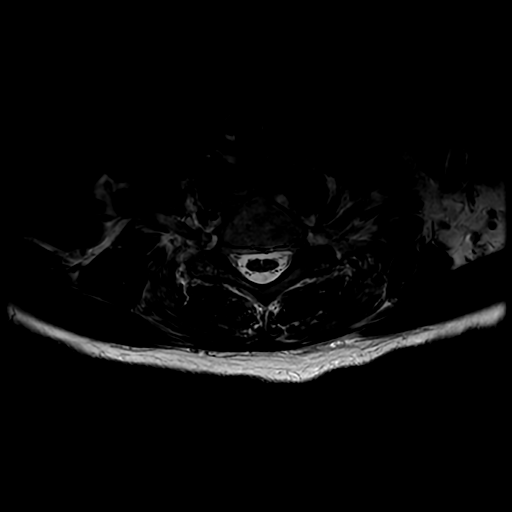
[im 11/28]
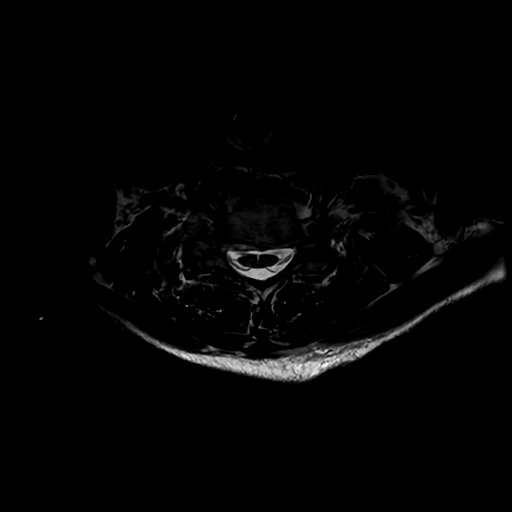
[im 17/28]
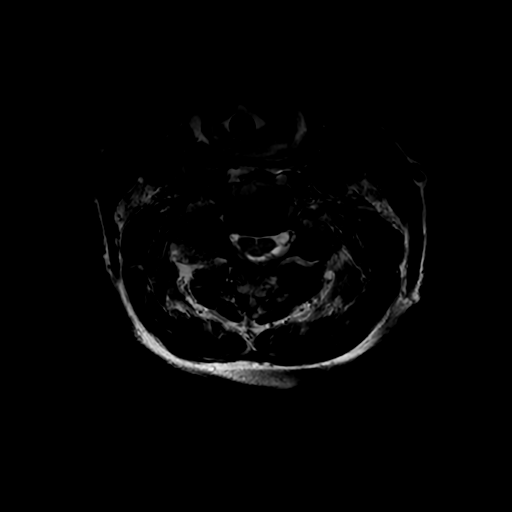
[im 22/28]
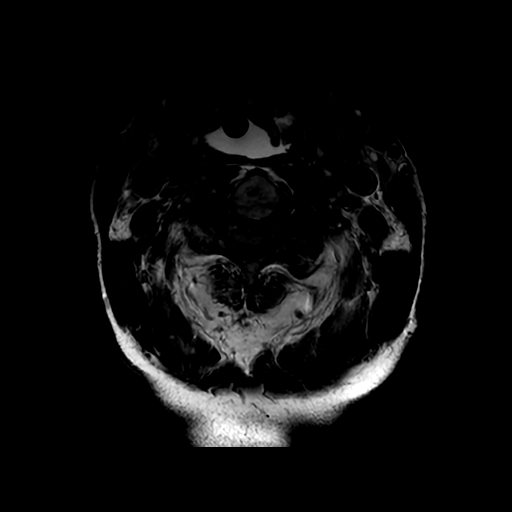
[im 28/28]
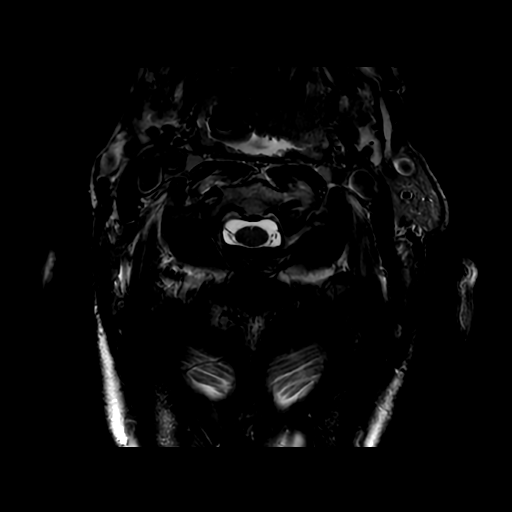

[Series 17: T1 · oblique · non-contrast · 3.0mm · 0.35mm/px · 5 of 28 slices shown]
[im 1/28]
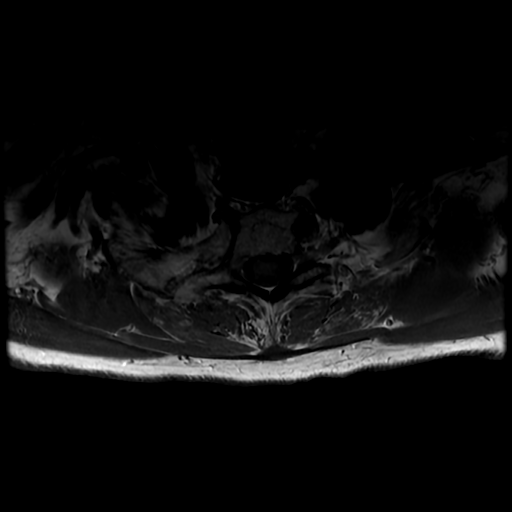
[im 6/28]
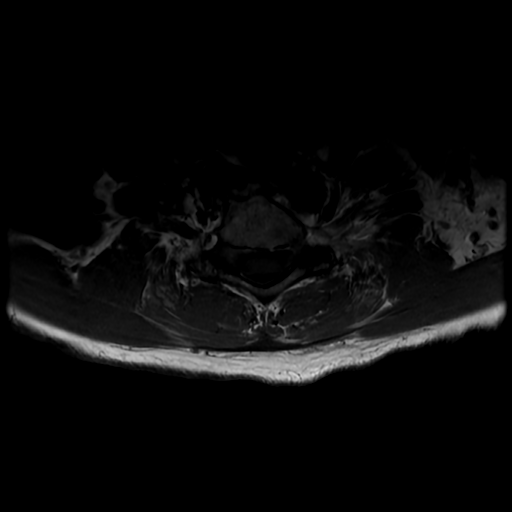
[im 11/28]
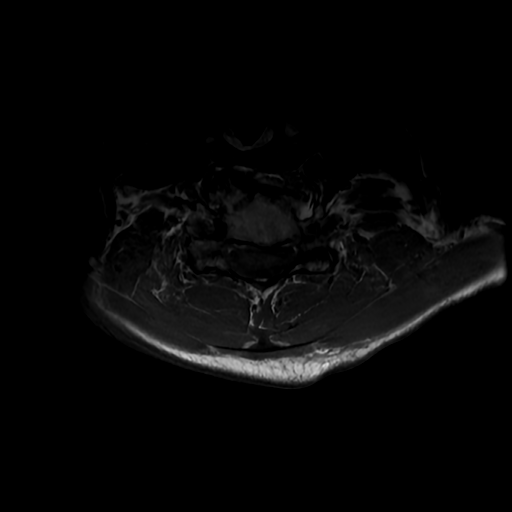
[im 17/28]
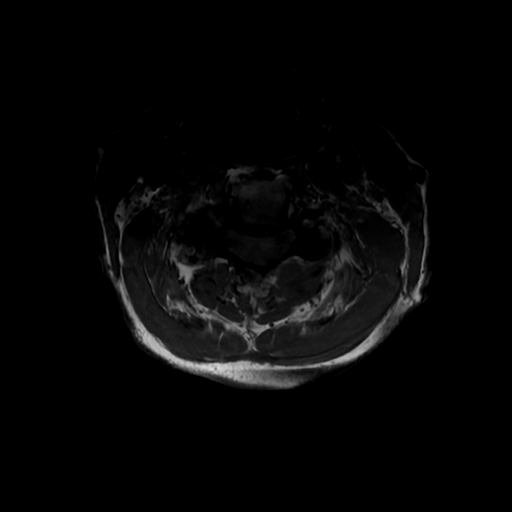
[im 28/28]
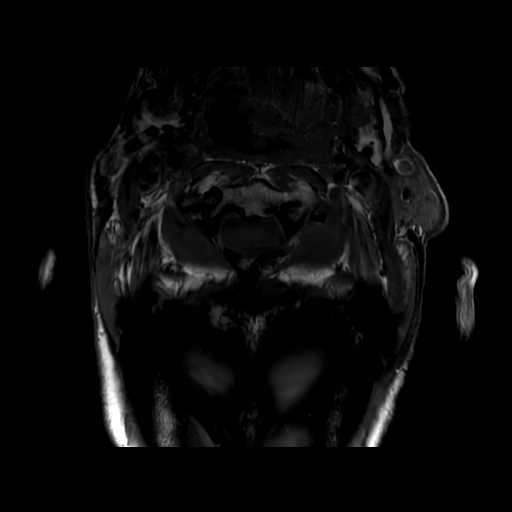

[Series 20: T1 fat-sat post-contrast · sagittal · 3.0mm · 0.43mm/px · 3 of 16 slices shown]
[im 1/16]
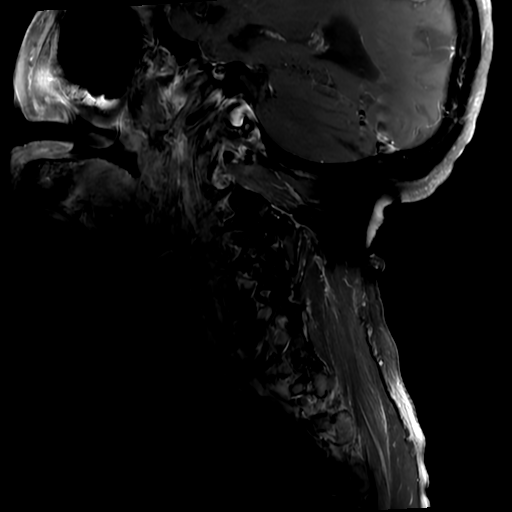
[im 8/16]
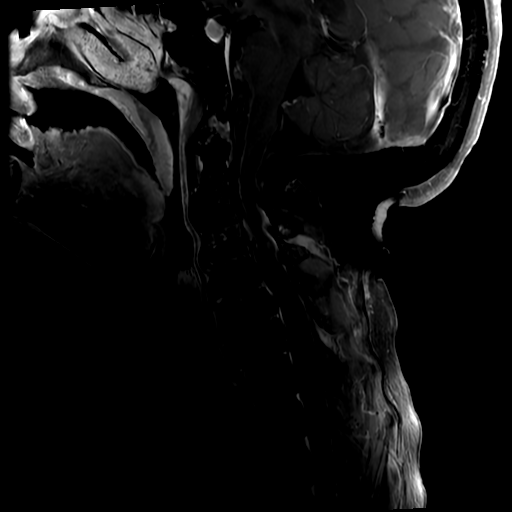
[im 16/16]
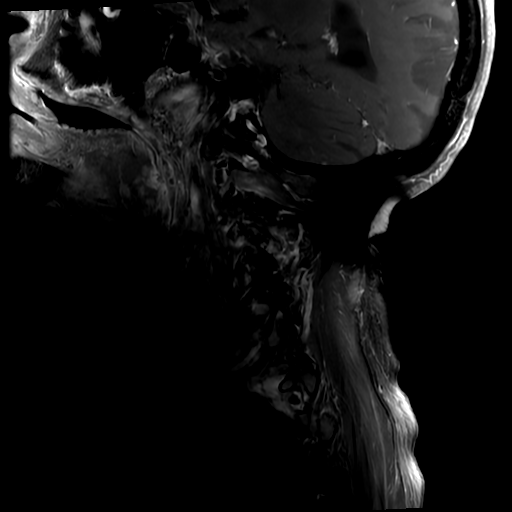

[18 of 48 positions shown; findings below may reference images not displayed]

FINDINGS: Alignment: Normal sagittal alignment. Straightening of the cervical
lordosis with mild kyphosis noted.

Vertebrae: Negative for fracture or mass.  No bone marrow edema.

Cord: Patchy hyperintensity in the cord bilaterally just below the
C3-4 disc space. There is mild enhancement in the cord in this area
bilaterally, consistent with acute compressive myelopathy.

Small linear hyperintensity in the cord on the right at C5-6 appears
chronic

Posterior Fossa, vertebral arteries, paraspinal tissues: The patient
is intubated. Retained secretions in the pharynx. Cerebral white
matter hyperintensity bilaterally compatible with chronic ischemia.
Patchy hyperintensity in the pons compatible with chronic ischemia.
No mass or fluid collection in the neck.

Disc levels:

C2-3: Disc degeneration and spurring. Moderate facet degeneration.
Cord flattening with moderate spinal stenosis. Moderate foraminal
encroachment bilaterally.

C3-4: Diffuse disc bulging and endplate spurring. Bilateral facet
hypertrophy. Cord flattening with moderate to severe spinal
stenosis. Moderate foraminal stenosis bilaterally

C4-5: Mild disc degeneration and spurring. Moderate facet
degeneration. Moderate right foraminal encroachment and mild left
foraminal encroachment. Mild central canal stenosis

C5-6: Disc degeneration with diffuse endplate spurring. Mild facet
degeneration. Spinal canal adequate in size. Moderate foraminal
encroachment bilaterally.

C6-7: Mild disc degeneration and disc bulging. Mild foraminal
narrowing bilaterally

C7-T1: Mild facet degeneration. Mild foraminal narrowing
bilaterally.
IMPRESSION: 1. Multilevel cervical spondylosis. Multilevel spinal and foraminal
encroachment due to spurring
2. Moderate spinal stenosis at C2-3
3. Moderate to severe spinal stenosis at C3-4 with cord
hyperintensity and mild enhancement compatible with acute
myelopathy.
4. Chronic myelopathy in the cord on the right at C5-6.

## 2022-01-19 IMAGING — MR MR MRA HEAD W/O CM
1 series · 16 of 48 positions shown · IV contrast (gadavist)
Comparison: Brain MRI [DATE].  Brain MRI [DATE].

CLINICAL DATA: Neuro deficit, acute, stroke suspected. Stroke/TIA,
determine embolic source. Fall. Left upper extremity contracture.
Ataxia.

EXAM:
MRI HEAD WITHOUT AND WITH CONTRAST
MRA HEAD WITHOUT CONTRAST
MRA NECK WITHOUT AND WITH CONTRAST
TECHNIQUE: Multiplanar, multi-echo pulse sequences of the brain and surrounding
structures were acquired without and with intravenous contrast.
Angiographic images of the Circle of Willis were acquired using MRA
technique without intravenous contrast. Angiographic images of the
neck were acquired using MRA technique without and with intravenous
contrast. Carotid stenosis measurements (when applicable) are
obtained utilizing NASCET criteria, using the distal internal
carotid diameter as the denominator.
CONTRAST:  5mL GADAVIST GADOBUTROL 1 MMOL/ML IV SOLN, 4mL GADAVIST
GADOBUTROL 1 MMOL/ML IV SOLN

[Series 3: ax (id) · axial · 1.0mm · 0.43mm/px · z∈[-41,+41]mm · 16 of 176 slices shown]
[im 1/176]
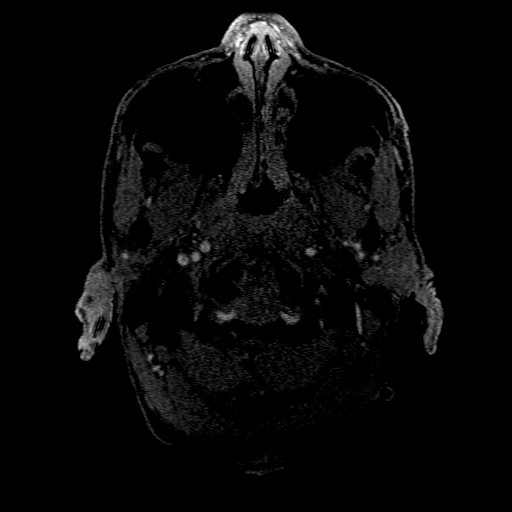
[im 4/176]
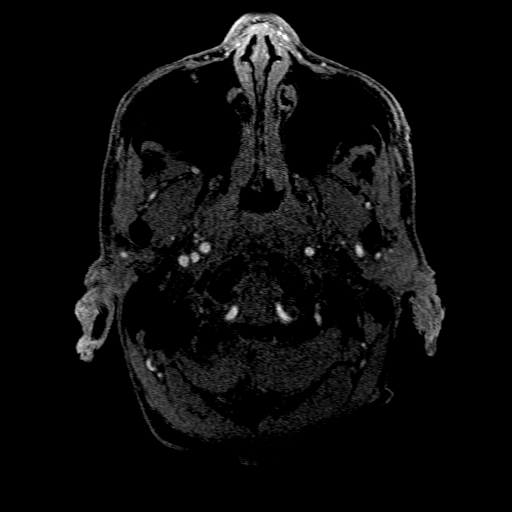
[im 8/176]
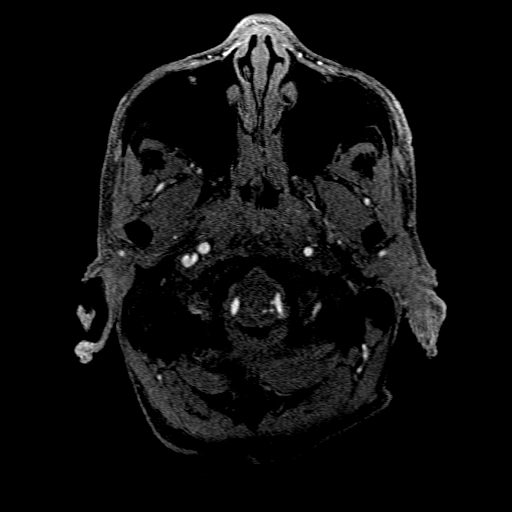
[im 12/176]
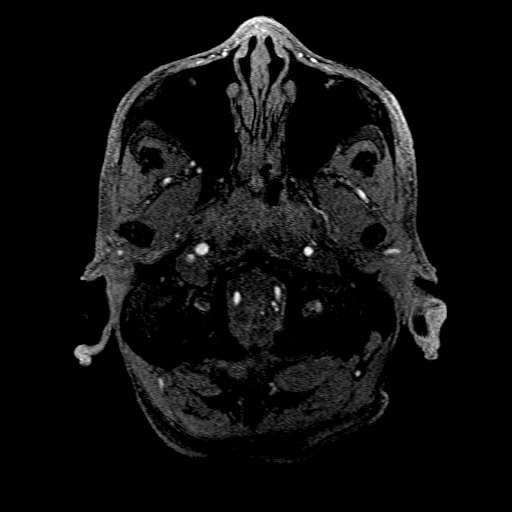
[im 15/176]
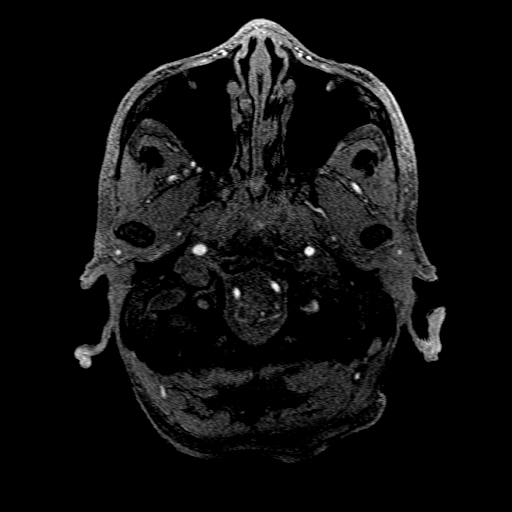
[im 19/176]
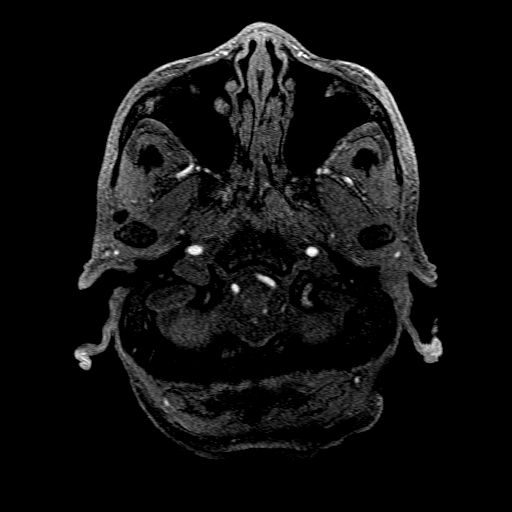
[im 30/176]
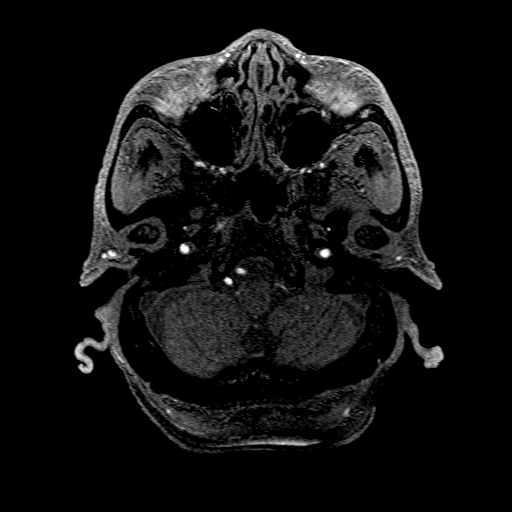
[im 34/176]
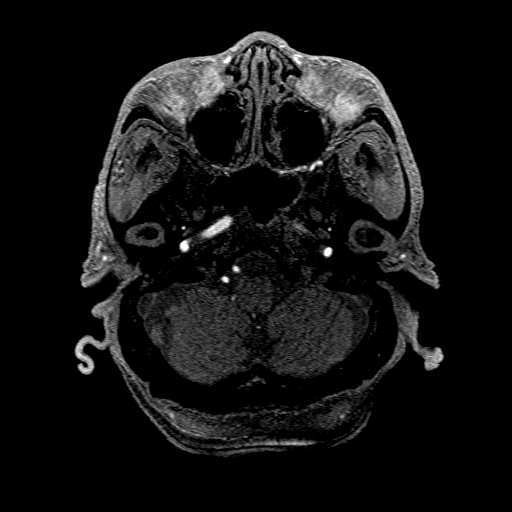
[im 56/176]
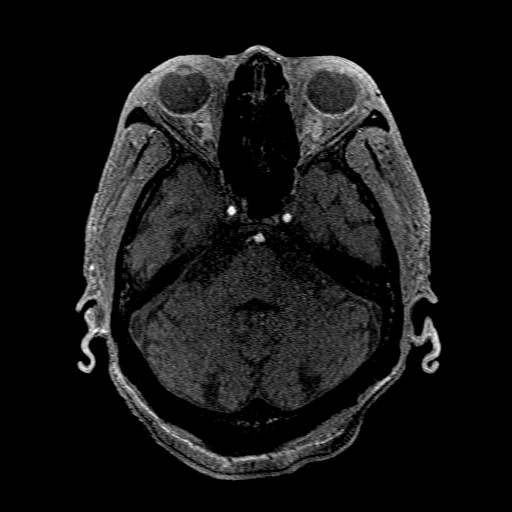
[im 79/176]
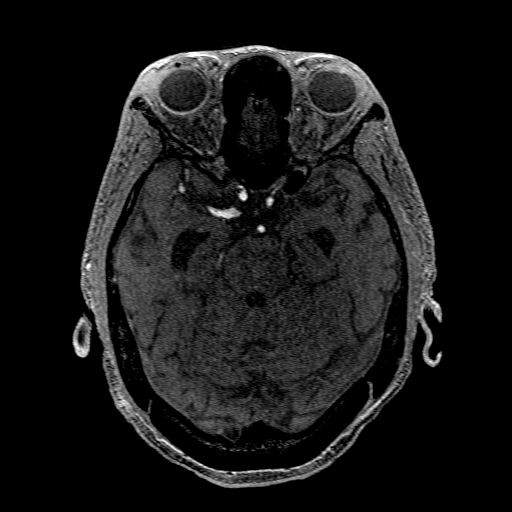
[im 90/176]
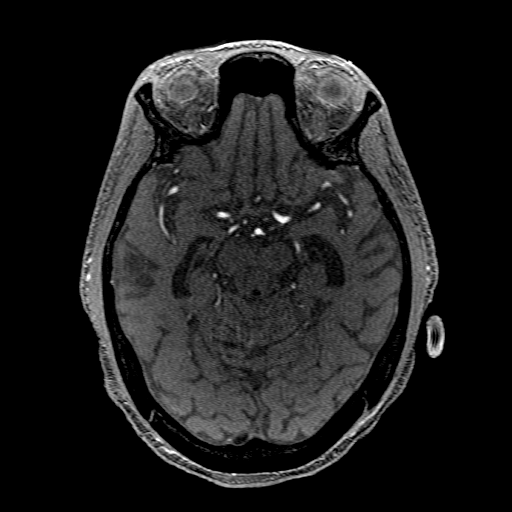
[im 101/176]
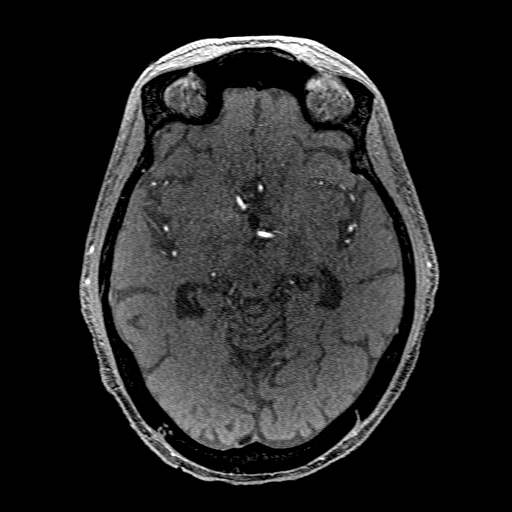
[im 123/176]
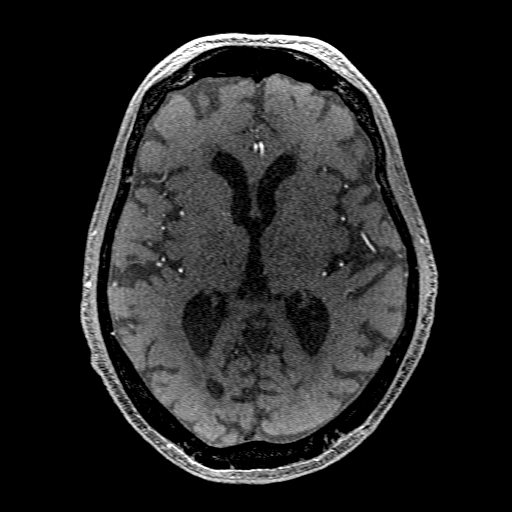
[im 146/176]
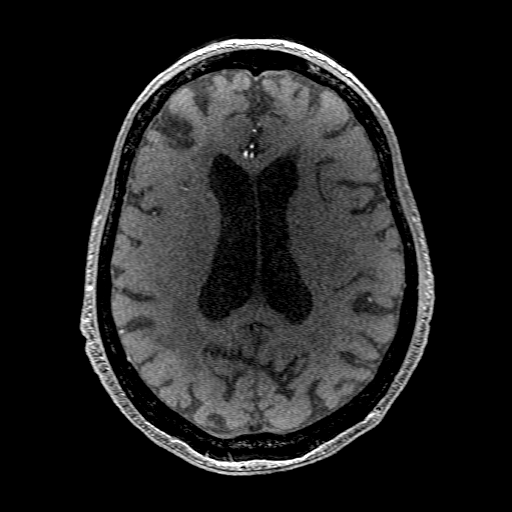
[im 149/176]
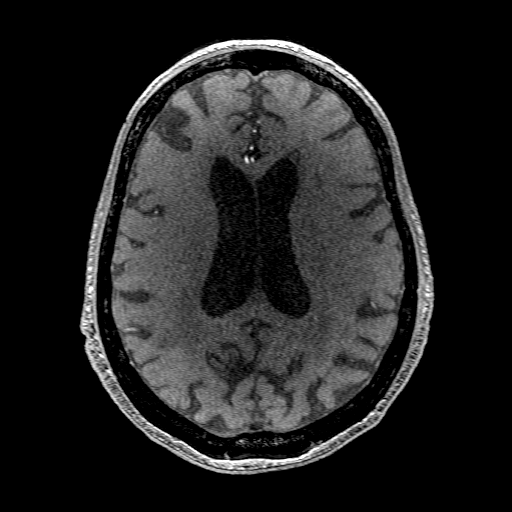
[im 168/176]
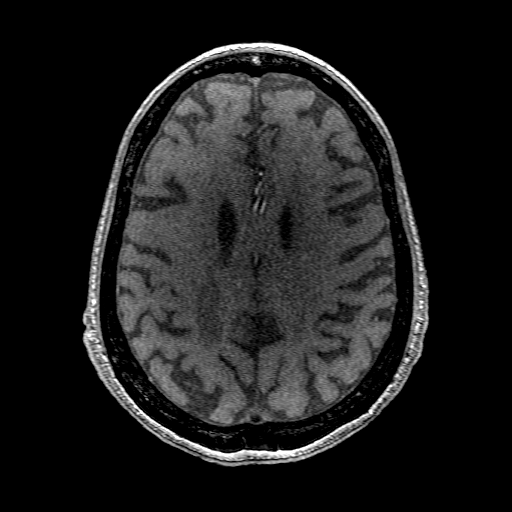

[16 of 48 positions shown; findings below may reference images not displayed]

FINDINGS: MRI HEAD FINDINGS

Brain:

Intermittently motion degraded examination. Most notably, there is
mild-to-moderate motion degradation of the axial T1 weighted
post-contrast sequence.

Mild-to-moderate generalized cerebral atrophy. Comparatively mild
cerebellar atrophy.

Moderate patchy and confluent T2 FLAIR hyperintense signal
abnormality within the cerebral white matter and pons, nonspecific
but compatible with chronic small vessel ischemic disease.

Nonspecific punctate chronic parenchymal microhemorrhage within the
mid left frontal lobe (series 8, image 65).

There is no acute infarct.

No evidence of an intracranial mass.

No extra-axial fluid collection.

No midline shift.

No pathologic intracranial enhancement identified.

Vascular: Maintained flow voids within the proximal large arterial
vessels.

Skull and upper cervical spine: No focal suspicious marrow lesion.
Incompletely assessed cervical spondylosis.

Sinuses/Orbits: Visualized orbits show no acute finding. Minimal
mucosal thickening within the bilateral maxillary sinuses.

Other: Nonspecific 2.2 cm cystic appearing subcutaneous lesion along
the inferolateral aspect of the left maxilla (for instance as seen
on series 24, image 24) (series 22, image 24) (series 5, image 20).

MRA HEAD FINDINGS

Mildly motion degraded exam.

Anterior circulation:

The intracranial internal carotid arteries are patent. Mild
atherosclerotic irregularity of both vessels. The M1 middle cerebral
arteries are patent. No M2 proximal branch occlusion or high-grade
proximal stenosis is identified. The anterior cerebral arteries are
patent. Hypoplastic left A1 segment. No intracranial aneurysm is
identified.

Posterior circulation:

The intracranial vertebral arteries are patent. The basilar artery
is patent. The posterior cerebral arteries are patent. Mild
atherosclerotic irregularity and narrowing of the P2 posterior
cerebral arteries, bilaterally. Posterior communicating arteries are
developmentally diminutive or absent, bilaterally.

Anatomic variants: As described.

MRA NECK FINDINGS

Aortic arch: The origin of the innominate and left common carotid
arteries. The visualized aortic arch is normal in caliber. No
appreciable hemodynamically significant innominate or proximal
subclavian artery stenosis.

Right carotid system: CCA and ICA patent within the neck without
hemodynamically significant stenosis (50% or greater). Mild
atherosclerotic plaque about the carotid bifurcation and within the
proximal ICA.

Left carotid system: CCA and ICA patent within the neck. Moderate
atherosclerotic plaque about the carotid bifurcation and within the
proximal ICA. Up to 40% stenosis of the proximal ICA.

Vertebral arteries: Codominant and patent within the neck without
significant stenosis.
IMPRESSION: MRI brain:

1. Intermittently motion degraded exam.
2. No evidence of acute intracranial abnormality.
3. Moderate chronic small vessel ischemic changes within the
cerebral white matter and pons.
4. Mild-to-moderate generalized cerebral atrophy.
5. Comparatively mild cerebellar atrophy.
6. Nonspecific 2.2 cm cystic-appearing subcutaneous lesion along the
inferolateral aspect of the right maxilla. Consider a non-emergent
contrast-enhanced maxillofacial CT for further evaluation.

MRA head:

1. No intracranial large vessel occlusion or proximal high-grade
arterial stenosis identified.
2. Mild atherosclerotic irregularity of the intracranial internal
carotid arteries.
3. Mild atherosclerotic irregularity and narrowing of the P2
posterior cerebral arteries, bilaterally.

MRA neck:

1. The common carotid and internal carotid arteries are patent
within the neck. Up to 40% atherosclerotic stenosis within the
proximal cervical left ICA. Mild atherosclerotic plaque about the
right carotid bifurcation and within the proximal ICA, without
significant stenosis at these sites.
2. Vertebral arteries patent within the neck without significant
stenosis.

## 2022-01-19 IMAGING — MR MR HEAD WO/W CM
6 of 13 series · 19 of 48 positions shown · IV contrast (Yes GAD)
Comparison: Brain MRI [DATE].  Brain MRI [DATE].

CLINICAL DATA: Neuro deficit, acute, stroke suspected. Stroke/TIA,
determine embolic source. Fall. Left upper extremity contracture.
Ataxia.

EXAM:
MRI HEAD WITHOUT AND WITH CONTRAST
MRA HEAD WITHOUT CONTRAST
MRA NECK WITHOUT AND WITH CONTRAST
TECHNIQUE: Multiplanar, multi-echo pulse sequences of the brain and surrounding
structures were acquired without and with intravenous contrast.
Angiographic images of the Circle of Willis were acquired using MRA
technique without intravenous contrast. Angiographic images of the
neck were acquired using MRA technique without and with intravenous
contrast. Carotid stenosis measurements (when applicable) are
obtained utilizing NASCET criteria, using the distal internal
carotid diameter as the denominator.
CONTRAST:  5mL GADAVIST GADOBUTROL 1 MMOL/ML IV SOLN, 4mL GADAVIST
GADOBUTROL 1 MMOL/ML IV SOLN

[Series 2: DWI · axial · 3.0mm · 0.94mm/px · z∈[-44,+84]mm · 5 of 88 slices shown (1 of 2)]
[im 1/88]
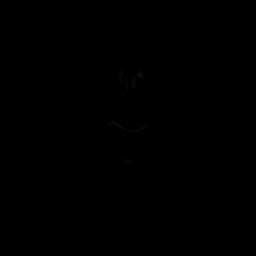
[im 22/88]
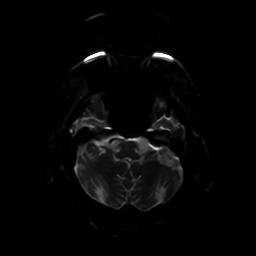
[im 44/88]
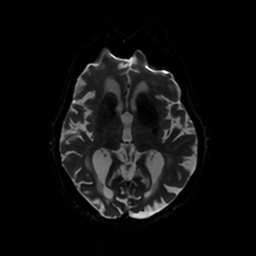
[im 66/88]
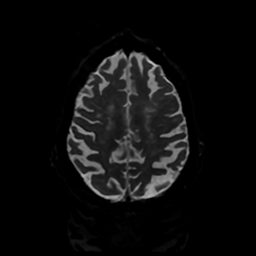
[im 88/88]
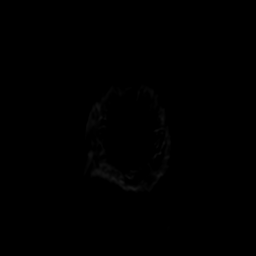

[Series 4: DWI · coronal · 4.0mm · 0.94mm/px · 5 of 70 slices shown (2 of 2)]
[im 1/70]
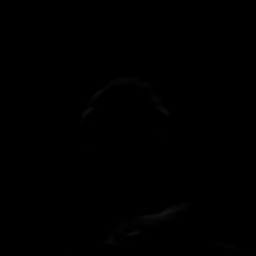
[im 18/70]
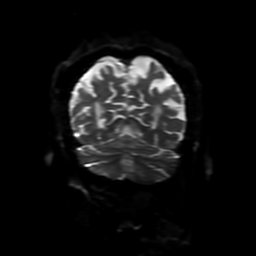
[im 35/70]
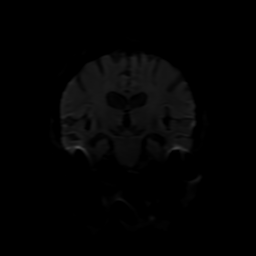
[im 52/70]
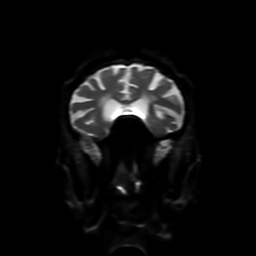
[im 70/70]
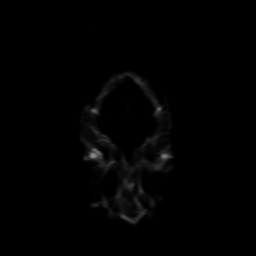

[Series 5: FLAIR · sagittal · 5.0mm · 0.23mm/px · 2 of 23 slices shown (1 of 2)]
[im 1/23]
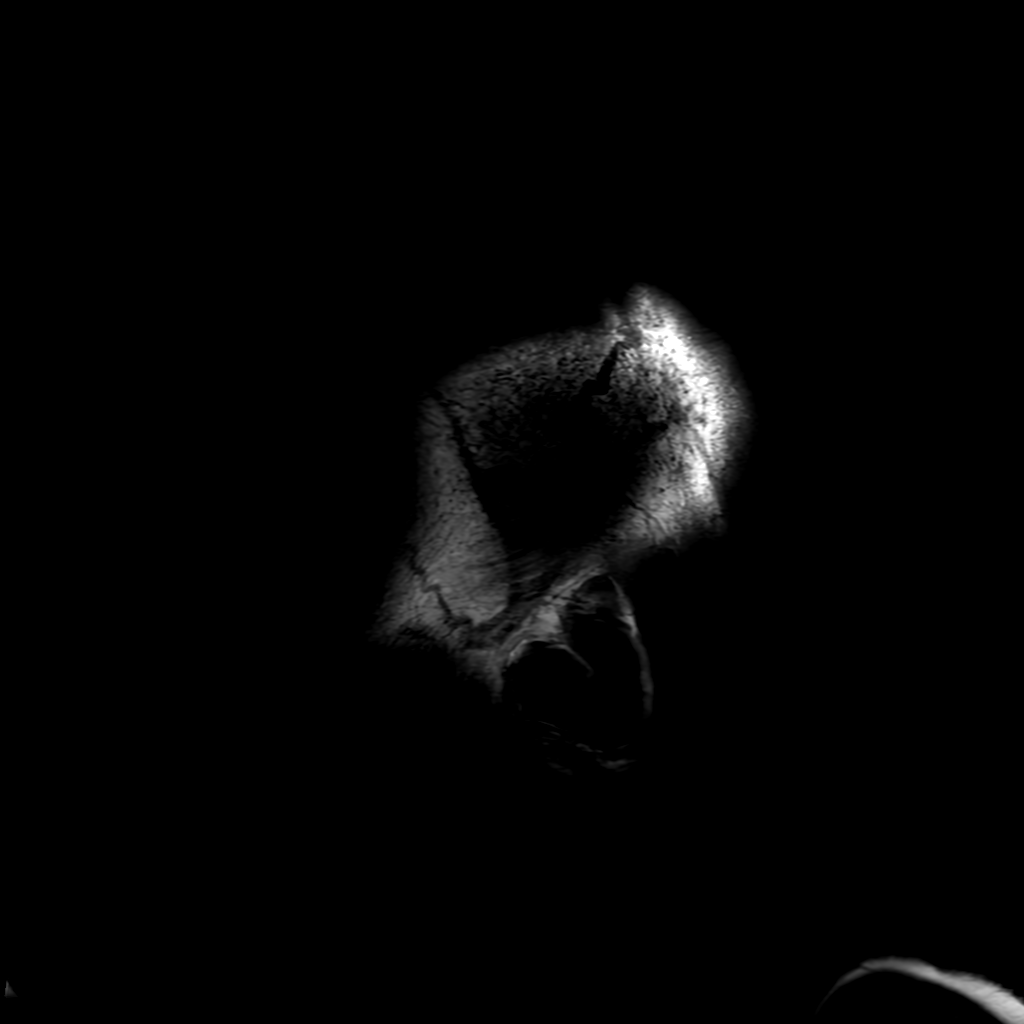
[im 23/23]
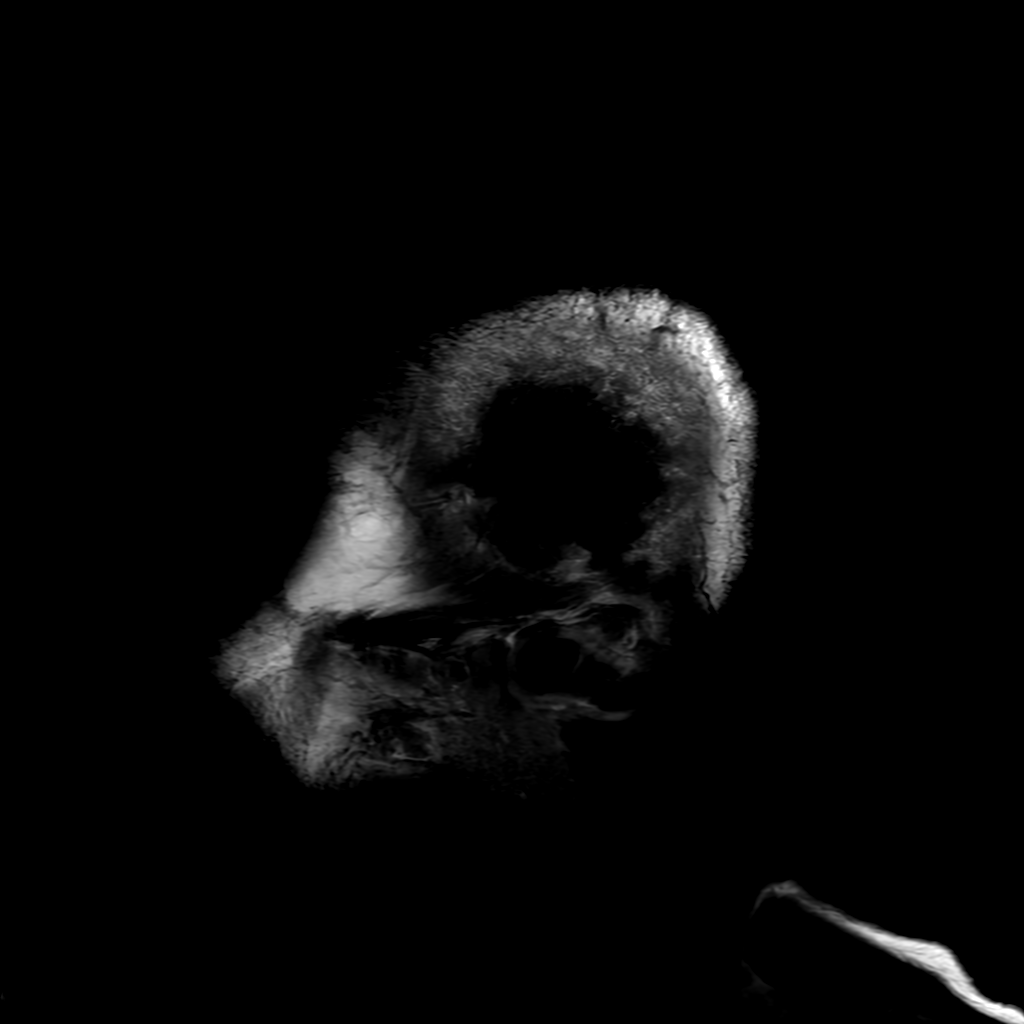

[Series 7: FLAIR · axial · 4.0mm · 0.45mm/px · z∈[-48,+83]mm · 2 of 31 slices shown (2 of 2)]
[im 1/31]
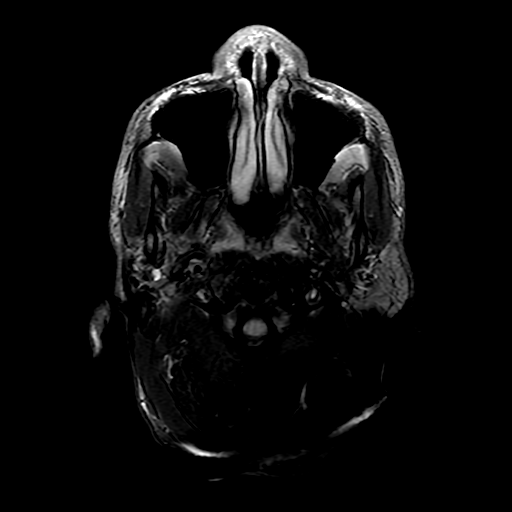
[im 31/31]
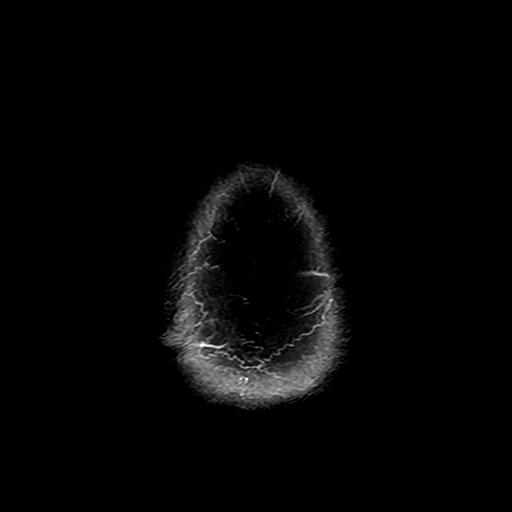

[Series 250: ADC · axial · 3.0mm · 0.94mm/px · z∈[-44,+84]mm · 3 of 44 slices shown (1 of 2)]
[im 1/44]
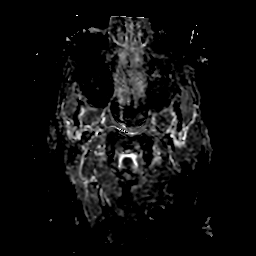
[im 22/44]
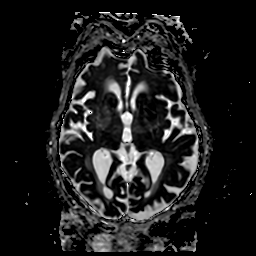
[im 44/44]
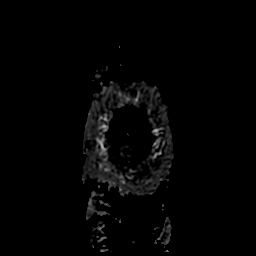

[Series 450: ADC · coronal · 4.0mm · 0.94mm/px · 2 of 35 slices shown (2 of 2)]
[im 1/35]
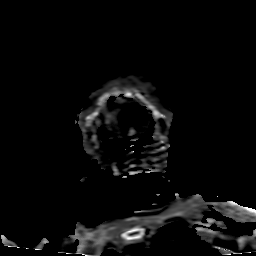
[im 35/35]
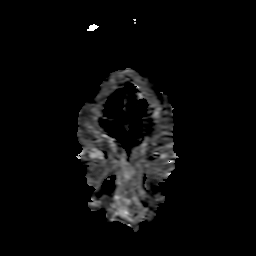

[19 of 48 positions shown; findings below may reference images not displayed]

FINDINGS: MRI HEAD FINDINGS

Brain:

Intermittently motion degraded examination. Most notably, there is
mild-to-moderate motion degradation of the axial T1 weighted
post-contrast sequence.

Mild-to-moderate generalized cerebral atrophy. Comparatively mild
cerebellar atrophy.

Moderate patchy and confluent T2 FLAIR hyperintense signal
abnormality within the cerebral white matter and pons, nonspecific
but compatible with chronic small vessel ischemic disease.

Nonspecific punctate chronic parenchymal microhemorrhage within the
mid left frontal lobe (series 8, image 65).

There is no acute infarct.

No evidence of an intracranial mass.

No extra-axial fluid collection.

No midline shift.

No pathologic intracranial enhancement identified.

Vascular: Maintained flow voids within the proximal large arterial
vessels.

Skull and upper cervical spine: No focal suspicious marrow lesion.
Incompletely assessed cervical spondylosis.

Sinuses/Orbits: Visualized orbits show no acute finding. Minimal
mucosal thickening within the bilateral maxillary sinuses.

Other: Nonspecific 2.2 cm cystic appearing subcutaneous lesion along
the inferolateral aspect of the left maxilla (for instance as seen
on series 24, image 24) (series 22, image 24) (series 5, image 20).

MRA HEAD FINDINGS

Mildly motion degraded exam.

Anterior circulation:

The intracranial internal carotid arteries are patent. Mild
atherosclerotic irregularity of both vessels. The M1 middle cerebral
arteries are patent. No M2 proximal branch occlusion or high-grade
proximal stenosis is identified. The anterior cerebral arteries are
patent. Hypoplastic left A1 segment. No intracranial aneurysm is
identified.

Posterior circulation:

The intracranial vertebral arteries are patent. The basilar artery
is patent. The posterior cerebral arteries are patent. Mild
atherosclerotic irregularity and narrowing of the P2 posterior
cerebral arteries, bilaterally. Posterior communicating arteries are
developmentally diminutive or absent, bilaterally.

Anatomic variants: As described.

MRA NECK FINDINGS

Aortic arch: The origin of the innominate and left common carotid
arteries. The visualized aortic arch is normal in caliber. No
appreciable hemodynamically significant innominate or proximal
subclavian artery stenosis.

Right carotid system: CCA and ICA patent within the neck without
hemodynamically significant stenosis (50% or greater). Mild
atherosclerotic plaque about the carotid bifurcation and within the
proximal ICA.

Left carotid system: CCA and ICA patent within the neck. Moderate
atherosclerotic plaque about the carotid bifurcation and within the
proximal ICA. Up to 40% stenosis of the proximal ICA.

Vertebral arteries: Codominant and patent within the neck without
significant stenosis.
IMPRESSION: MRI brain:

1. Intermittently motion degraded exam.
2. No evidence of acute intracranial abnormality.
3. Moderate chronic small vessel ischemic changes within the
cerebral white matter and pons.
4. Mild-to-moderate generalized cerebral atrophy.
5. Comparatively mild cerebellar atrophy.
6. Nonspecific 2.2 cm cystic-appearing subcutaneous lesion along the
inferolateral aspect of the right maxilla. Consider a non-emergent
contrast-enhanced maxillofacial CT for further evaluation.

MRA head:

1. No intracranial large vessel occlusion or proximal high-grade
arterial stenosis identified.
2. Mild atherosclerotic irregularity of the intracranial internal
carotid arteries.
3. Mild atherosclerotic irregularity and narrowing of the P2
posterior cerebral arteries, bilaterally.

MRA neck:

1. The common carotid and internal carotid arteries are patent
within the neck. Up to 40% atherosclerotic stenosis within the
proximal cervical left ICA. Mild atherosclerotic plaque about the
right carotid bifurcation and within the proximal ICA, without
significant stenosis at these sites.
2. Vertebral arteries patent within the neck without significant
stenosis.

## 2022-01-19 SURGERY — MRI WITH ANESTHESIA
Anesthesia: General

## 2022-01-19 MED ORDER — DEXAMETHASONE SODIUM PHOSPHATE 10 MG/ML IJ SOLN
10.0000 mg | Freq: Once | INTRAMUSCULAR | Status: AC
  Administered 2022-01-19: 10 mg via INTRAVENOUS
  Filled 2022-01-19: qty 1

## 2022-01-19 MED ORDER — DEXAMETHASONE SODIUM PHOSPHATE 10 MG/ML IJ SOLN: 6.0000 mg | Freq: Four times a day (QID) | INTRAMUSCULAR | Status: DC

## 2022-01-19 MED ORDER — CHLORHEXIDINE GLUCONATE 0.12 % MT SOLN
15.0000 mL | Freq: Once | OROMUCOSAL | Status: AC
  Administered 2022-01-19: 15 mL via OROMUCOSAL
  Filled 2022-01-19: qty 15

## 2022-01-19 MED ORDER — DEXAMETHASONE SODIUM PHOSPHATE 10 MG/ML IJ SOLN
INTRAMUSCULAR | Status: DC | PRN
Start: 2022-01-19 — End: 2022-01-19
  Administered 2022-01-19: 5 mg via INTRAVENOUS

## 2022-01-19 MED ORDER — PHENYLEPHRINE 40 MCG/ML (10ML) SYRINGE FOR IV PUSH (FOR BLOOD PRESSURE SUPPORT)
PREFILLED_SYRINGE | INTRAVENOUS | Status: DC | PRN
  Administered 2022-01-19: 120 ug via INTRAVENOUS
  Administered 2022-01-19: 80 ug via INTRAVENOUS
  Administered 2022-01-19: 120 ug via INTRAVENOUS
  Administered 2022-01-19: 80 ug via INTRAVENOUS
  Administered 2022-01-19 (×2): 120 ug via INTRAVENOUS

## 2022-01-19 MED ORDER — FENTANYL CITRATE (PF) 100 MCG/2ML IJ SOLN: 25.0000 ug | INTRAMUSCULAR | Status: DC | PRN

## 2022-01-19 MED ORDER — ORAL CARE MOUTH RINSE: 15.0000 mL | Freq: Once | OROMUCOSAL | Status: AC

## 2022-01-19 MED ORDER — SUGAMMADEX SODIUM 200 MG/2ML IV SOLN
INTRAVENOUS | Status: DC | PRN
  Administered 2022-01-19: 200 mg via INTRAVENOUS

## 2022-01-19 MED ORDER — GADOBUTROL 1 MMOL/ML IV SOLN
4.0000 mL | Freq: Once | INTRAVENOUS | Status: AC | PRN
  Administered 2022-01-19: 4 mL via INTRAVENOUS

## 2022-01-19 MED ORDER — LORAZEPAM 2 MG/ML IJ SOLN
1.0000 mg | Freq: Once | INTRAMUSCULAR | Status: AC
  Administered 2022-01-19: 1 mg via INTRAVENOUS
  Filled 2022-01-19: qty 1

## 2022-01-19 MED ORDER — ONDANSETRON HCL 4 MG/2ML IJ SOLN
INTRAMUSCULAR | Status: DC | PRN
  Administered 2022-01-19: 4 mg via INTRAVENOUS

## 2022-01-19 MED ORDER — PROPOFOL 10 MG/ML IV BOLUS
INTRAVENOUS | Status: DC | PRN
  Administered 2022-01-19: 50 mg via INTRAVENOUS

## 2022-01-19 MED ORDER — ONDANSETRON HCL 4 MG/2ML IJ SOLN: 4.0000 mg | Freq: Once | INTRAMUSCULAR | Status: DC | PRN

## 2022-01-19 MED ORDER — ROCURONIUM BROMIDE 10 MG/ML (PF) SYRINGE
PREFILLED_SYRINGE | INTRAVENOUS | Status: DC | PRN
  Administered 2022-01-19: 30 mg via INTRAVENOUS

## 2022-01-19 MED ORDER — LACTATED RINGERS IV SOLN: INTRAVENOUS | Status: DC

## 2022-01-19 MED ORDER — ACETAMINOPHEN 10 MG/ML IV SOLN: 700.0000 mg | Freq: Once | INTRAVENOUS | Status: DC | PRN

## 2022-01-19 MED ORDER — LIDOCAINE 2% (20 MG/ML) 5 ML SYRINGE
INTRAMUSCULAR | Status: DC | PRN
  Administered 2022-01-19: 40 mg via INTRAVENOUS

## 2022-01-19 MED ORDER — DEXAMETHASONE SODIUM PHOSPHATE 4 MG/ML IJ SOLN
2.0000 mg | Freq: Four times a day (QID) | INTRAMUSCULAR | Status: DC
  Administered 2022-01-20 (×2): 2 mg via INTRAVENOUS
  Filled 2022-01-19 (×3): qty 1

## 2022-01-19 MED ORDER — AMISULPRIDE (ANTIEMETIC) 5 MG/2ML IV SOLN: 10.0000 mg | Freq: Once | INTRAVENOUS | Status: DC | PRN

## 2022-01-19 NOTE — Assessment & Plan Note (Signed)
Nicotine patch once mentation improves.

## 2022-01-19 NOTE — Anesthesia Postprocedure Evaluation (Signed)
Anesthesia Post Note  Patient: Janice Castro  Procedure(s) Performed: MRI WITH ANESTHESIA     Patient location during evaluation: PACU Anesthesia Type: General Level of consciousness: responds to stimulation Pain management: pain level controlled Vital Signs Assessment: post-procedure vital signs reviewed and stable Respiratory status: spontaneous breathing, nonlabored ventilation, respiratory function stable and patient connected to nasal cannula oxygen Cardiovascular status: blood pressure returned to baseline and stable Postop Assessment: no apparent nausea or vomiting Anesthetic complications: no   No notable events documented.  Last Vitals:  Vitals:   01/19/22 1610 01/19/22 2013  BP: (!) 149/79 (!) 142/79  Pulse: 82 84  Resp: 20 14  Temp: (!) 36.4 C 36.6 C  SpO2: 97% 100%    Last Pain:  Vitals:   01/19/22 2013  TempSrc: Oral  PainSc:                  Catheryn Bacon Noretta Frier

## 2022-01-19 NOTE — Assessment & Plan Note (Signed)
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 With that patient has ataxia with recurrent fall. Had 2 falls in last 1 week. Daughter reports that the contracture is now extending to the right upper extremity as well. Has seen neurology outpatient and work-up was initiated. At present admitted in the hospital due to recurrent fall and inability to care for self. Exam somewhat confused.  Left-sided weakness and contracture as well as pain with increased rigidity. Discussed with neurology.  Recommended MRI. Unable to complete MRI without sedation. Currently being transferred to [facility] for MRI under anesthesia. Coordinated with MRI tech as well as anesthesia and transport. Never had any anesthesia before therefore no allergic reactions. MRI brain with and without contrast, MRI C-spine with and without contrast, MRA neck and head ordered. Also blood work recommended by neurology also ordered. Currently remains NPO.  PT OT therapy consulted. Passed swallowing evaluation. Continue aspirin.  Statin.  Unable to patient will be

## 2022-01-19 NOTE — Anesthesia Preprocedure Evaluation (Addendum)
Anesthesia Evaluation  Patient identified by MRN, date of birth, ID band Patient unresponsive    Reviewed: Allergy & Precautions, NPO status , Patient's Chart, lab work & pertinent test results  Airway Mallampati: II  TM Distance: >3 FB Neck ROM: Full    Dental  (+) Edentulous Upper, Edentulous Lower   Pulmonary Current Smoker and Patient abstained from smoking.,    Pulmonary exam normal breath sounds clear to auscultation       Cardiovascular hypertension, Pt. on medications Normal cardiovascular exam Rhythm:Regular Rate:Normal  ECG: SR, rate 73  ECHO: 1. Left ventricular ejection fraction, by estimation, is 70 to 75%. The left ventricle has hyperdynamic function. The left ventricle has no regional wall motion abnormalities. There is mild asymmetric left ventricular hypertrophy of the basal-septal segment. Left ventricular diastolic parameters are consistent with Grade I diastolic dysfunction (impaired relaxation). The average left ventricular global longitudinal strain is -22.7 %. The global longitudinal strain is normal. 2. Right ventricular systolic function is normal. The right ventricular size is normal. 3. The mitral valve is normal in structure. Trivial mitral valve regurgitation. 4. The aortic valve is tricuspid. There is mild calcification of the aortic valve. There is mild thickening of the aortic valve. Aortic valve regurgitation is not visualized. Aortic valve sclerosis/calcification is present, without any evidence of aortic stenosis. 5. The inferior vena cava is normal in size with greater than 50% respiratory variability, suggesting right atrial pressure of 3 mmHg.   Neuro/Psych PSYCHIATRIC DISORDERS Schizophrenia Dementia  Neuromuscular disease    GI/Hepatic negative GI ROS, Neg liver ROS,   Endo/Other  negative endocrine ROS  Renal/GU negative Renal ROS     Musculoskeletal  (+) Arthritis ,   Abdominal    Peds  Hematology negative hematology ROS (+)   Anesthesia Other Findings Ataxia  Reproductive/Obstetrics                            Anesthesia Physical Anesthesia Plan  ASA: 3  Anesthesia Plan: General   Post-op Pain Management:    Induction: Intravenous  PONV Risk Score and Plan: 2 and Ondansetron, Dexamethasone and Treatment may vary due to age or medical condition  Airway Management Planned: LMA  Additional Equipment:   Intra-op Plan:   Post-operative Plan: Extubation in OR  Informed Consent: I have reviewed the patients History and Physical, chart, labs and discussed the procedure including the risks, benefits and alternatives for the proposed anesthesia with the patient or authorized representative who has indicated his/her understanding and acceptance.     Consent reviewed with POA  Plan Discussed with: CRNA  Anesthesia Plan Comments: (Potential post operative sedation issues discussed with daughter )       Anesthesia Quick Evaluation

## 2022-01-19 NOTE — Anesthesia Procedure Notes (Signed)
Procedure Name: Intubation Date/Time: 01/19/2022 1:21 PM Performed by: Katina Degree, CRNA Pre-anesthesia Checklist: Patient identified, Emergency Drugs available, Suction available and Patient being monitored Patient Re-evaluated:Patient Re-evaluated prior to induction Oxygen Delivery Method: Circle system utilized Preoxygenation: Pre-oxygenation with 100% oxygen Induction Type: IV induction Ventilation: Mask ventilation without difficulty Laryngoscope Size: Glidescope and 4 Grade View: Grade I Tube type: Oral Tube size: 6.5 mm Number of attempts: 1 Airway Equipment and Method: Stylet and Oral airway Placement Confirmation: ETT inserted through vocal cords under direct vision, positive ETCO2 and breath sounds checked- equal and bilateral Secured at: 21 cm Tube secured with: Tape Dental Injury: Teeth and Oropharynx as per pre-operative assessment

## 2022-01-19 NOTE — Assessment & Plan Note (Signed)
Continuing statin for now.

## 2022-01-19 NOTE — ED Notes (Signed)
Pt in bed, pt c/o she would like a cigarette, explained that she could not smoke and that at this time a nicotine patch wouldn't help because it would be removed for her upcoming MRI.  Iv placed L forearm and blood was drawn and sent, daughter at bedside, pt oriented to person and place, pt doesn't know day of the week, re oriented pt.

## 2022-01-19 NOTE — Assessment & Plan Note (Signed)
Blood pressure somewhat elevated. On Norvasc 10 mg.  We will continue.

## 2022-01-19 NOTE — Consult Note (Addendum)
Neurology Consultation  Reason for Consult: New onset LUE contracture and ataxia Referring Physician: Ernie Avena MD  CC: Unwitnessed fall  History is obtained from:patient   HPI: Janice Castro is a 86 y.o. female w/ PMHx of schizophrenia, tobacco use, HTN, dementia of unclear etiology, new LUE contracture with associated ataxia presented to ED for unwitnessed fall. Unknown if patient had LOC. Patient has had L arm contracture at baseline but now is developing in right side. Per daughter on admission, patient was already back to mental baseline. Patient does not have known hx of CVA. Patient was seen by neurology in December 2022 and was diagnosed with dementia (MMSE 8/30)  No family at bedside. Patient resting comfortably in bed. Patient pleasant but sometimes confused. Patient states she is unable to recall why she is at the hospital. Patient is oriented to name and location but not time or context.  ROS: Unable to obtain due to altered mental status.   Past Medical History:  Diagnosis Date   Arthritis    Hypertension    Schizophrenia (HCC)    Tobacco abuse     Family History  Problem Relation Age of Onset   Schizophrenia Mother    Migraines Other    Schizophrenia Son    Schizophrenia Son    Schizophrenia Daughter      Social History:   reports that she has been smoking cigarettes. She has been smoking an average of 1 pack per day. She has never used smokeless tobacco. She reports that she does not currently use alcohol. She reports that she does not currently use drugs.  Medications  Current Facility-Administered Medications:    acetaminophen (TYLENOL) tablet 650 mg, 650 mg, Oral, Q6H PRN **OR** acetaminophen (TYLENOL) suppository 650 mg, 650 mg, Rectal, Q6H PRN, Synetta Fail, MD   amLODipine (NORVASC) tablet 10 mg, 10 mg, Oral, Daily, Synetta Fail, MD   aspirin EC tablet 81 mg, 81 mg, Oral, Daily, Synetta Fail, MD   enoxaparin (LOVENOX) injection  30 mg, 30 mg, Subcutaneous, Q24H, Synetta Fail, MD, 30 mg at 01/18/22 2149   polyethylene glycol (MIRALAX / GLYCOLAX) packet 17 g, 17 g, Oral, Daily PRN, Synetta Fail, MD   simvastatin (ZOCOR) tablet 5 mg, 5 mg, Oral, Daily, Synetta Fail, MD   sodium chloride flush (NS) 0.9 % injection 3 mL, 3 mL, Intravenous, Q12H, Synetta Fail, MD, 3 mL at 01/19/22 1006   Exam: Current vital signs: BP (!) 149/79 (BP Location: Left Arm)    Pulse 82    Temp (!) 97.5 F (36.4 C) (Axillary)    Resp 20    Ht 5\' 4"  (1.626 m)    Wt 47 kg    SpO2 97%    BMI 17.79 kg/m  Vital signs in last 24 hours: Temp:  [97.5 F (36.4 C)-98.7 F (37.1 C)] 97.5 F (36.4 C) (02/14 1610) Pulse Rate:  [67-93] 82 (02/14 1610) Resp:  [16-20] 20 (02/14 1610) BP: (113-169)/(55-109) 149/79 (02/14 1610) SpO2:  [95 %-100 %] 97 % (02/14 1610)  GENERAL: Awake, alert, in no acute distress Psych: Affect appropriate for situation, patient is calm and cooperative with examination Head: Normocephalic and atraumatic, without obvious abnormality EENT: Normal conjunctivae, dry mucous membranes, no OP obstruction LUNGS: Normal respiratory effort. Non-labored breathing on room air CV: Regular rate and rhythm on telemetry ABDOMEN: Soft, non-tender, non-distended Extremities: warm, well perfused, without obvious deformity  NEURO:  Mental Status: Awake, alert, and oriented to person,  place, but not time, and situation. She is unable to provide a clear and coherent history of present illness. Speech/Language: speech is tangential and garbled.   No neglect is noted Cranial Nerves:  II: PERRL Visual fields full.  III, IV, VI: EOMI. Lid elevation symmetric and full.  V: Sensation is intact to light touch and symmetrical to face. Blinks to threat. Moves jaw back and forth.  VII: Face is symmetric resting and smiling. Able to puff cheeks and raise eyebrows.  VIII: Hearing intact to voice IX, X: Palate elevation is  symmetric. Phonation normal.  XI: Normal sternocleidomastoid and trapezius muscle strength XII: Tongue protrudes midline without fasciculations.   Motor: 5/5 strength is all muscle groups. Left arm is perpetually contracted. Right arm is able to extend to about 90 degrees but increased resistance from there.  Tone is increased in right and left bicep. Bulk is normal.  Sensation: Intact to light touch bilaterally in all four extremities. No extinction to DSS present.  Coordination: Unable to assess due to noncooperation DTRs: 2+ throughout.  Gait: Deferred  Labs I have reviewed labs in epic and the results pertinent to this consultation are:   CBC    Component Value Date/Time   WBC 7.4 01/19/2022 1018   RBC 3.71 (L) 01/19/2022 1018   HGB 12.1 01/19/2022 1018   HCT 36.0 01/19/2022 1018   PLT 309 01/19/2022 1018   MCV 97.0 01/19/2022 1018   MCH 32.6 01/19/2022 1018   MCHC 33.6 01/19/2022 1018   RDW 13.2 01/19/2022 1018   LYMPHSABS 1.3 01/18/2022 1345   MONOABS 0.9 01/18/2022 1345   EOSABS 0.1 01/18/2022 1345   BASOSABS 0.0 01/18/2022 1345    CMP     Component Value Date/Time   NA 141 01/19/2022 1018   NA 143 12/11/2021 1036   K 4.1 01/19/2022 1018   CL 108 01/19/2022 1018   CO2 25 01/19/2022 1018   GLUCOSE 109 (H) 01/19/2022 1018   BUN 16 01/19/2022 1018   BUN 14 12/11/2021 1036   CREATININE 0.74 01/19/2022 1018   CALCIUM 9.1 01/19/2022 1018   PROT 7.6 01/18/2022 1345   ALBUMIN 4.1 01/18/2022 1345   AST 25 01/18/2022 1345   ALT 25 01/18/2022 1345   ALKPHOS 120 01/18/2022 1345   BILITOT 0.4 01/18/2022 1345   GFRNONAA >60 01/19/2022 1018   GFRAA >60 03/27/2018 0519    Lipid Panel  No results found for: CHOL, TRIG, HDL, CHOLHDL, VLDL, LDLCALC, LDLDIRECT   Imaging I have reviewed the images obtained:  CT-scan of the brain: no acute intracranial abnormalities  MRA Head/Neck: No LVO. Mild atherosclerotic irregularity of bilateral ICAs and bilateral P2. 40%  atherosclerotic stenosis within proximal cervical left ICA.   MR C-spine: multilevel cervical spondylosis. Multilevel spinal and foraminal encroachment due to spurring. Moderate spinal stenosis at C2-3. Moderate to severe spinal stenosis at C3-4 with cord hyperintensity and mild enhancement. Chronic myelopathy at C5-6  MRI examination of the brain: No acute intracranial abnormality. Moderate chronic small vessel ischemic changes. Mild-moderate generalized cerebellar atrophy. Nonspecific 2.2 cm cystic appearing subcutaneous lesion along right maxilla.   Assessment: EMMILYNN MARUT is a 86 y.o. female w/ PMHx of schizophrenia, tobacco use, HTN, dementia of unclear etiology, LUE contracture with associated ataxia presented to ED for unwitnessed fall.  Impression:Acute on chronic cervical myelopathy.   Recommendations: -Decadron 10 mg now and 2 mg q6 hours -Recommend NSGY consult -Neuro will continue to follow   Park Pope, MD PGY1 Resident  ATTENDING ATTESTATION: The patient history of schizophrenia and dementia.  She had an unwitnessed fall.  History of left arm contractures but with new onset right arm weakness after the fall.  MRI of the brain and cervical spine were recommended and had to be done under sedation due to her schizophrenia and agitation. Cervical spine MRI shows acute myelopathy C3-C4 and multilevel cervical spondylosis.  Recommend giving a one-time dose of 10 mg Decadron then 2 mg Q6.  Consult neurosurgery, this was conveyed to the primary team.  Dr. Viviann Spare evaluated pt independently, reviewed imaging, chart, labs. Discussed and formulated plan with the APP. Please see APP note above for details.   MDM: high due to acute myelopathy and potential for severe worsening.   Arohi Salvatierra,MD

## 2022-01-19 NOTE — Assessment & Plan Note (Signed)
PT/OT consult 

## 2022-01-19 NOTE — Progress Notes (Signed)
Progress Note   Patient: Janice Castro BTD:[id number] DOB: July 28, 1936 DOA: 01/18/2022     Hospitalization day: 0 DOS: the patient was seen and examined on 01/19/2022   Brief hospital course: Janice Castro is a 86 y.o. female with medical history significant of schizophrenia, anemia, hypertension, tobacco use, hyperlipidemia, dementia (newly diagnosed) who presents after a fall at home. Around Thanksgiving suddenly started having difficulty walking 12/3 seen in ER.  MRI brain did not reveal any acute infarct. 12/9 seen by neurology.  Metabolic work-up, MRA PT OT recommended. 2/13 admitted to the hospital for recurrent fall.  Concern with right upper extremity contracture on top of left upper extremity contracture. 2/14 transfer to [facility], MRI under anesthesia, neurology consult.  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 With that patient has ataxia with recurrent fall. Had 2 falls in last 1 week. Daughter reports that the contracture is now extending to the right upper extremity as well. Has seen neurology outpatient and work-up was initiated. At present admitted in the hospital due to recurrent fall and inability to care for self. Exam somewhat confused.  Left-sided weakness and contracture as well as pain with increased rigidity. Discussed with neurology.  Recommended MRI. Unable to complete MRI without sedation. Currently being transferred to [facility] for MRI under anesthesia. Coordinated with MRI tech as well as anesthesia and transport. Never had any anesthesia before therefore no allergic reactions. MRI brain with and without contrast, MRI C-spine with and without contrast, MRA neck and head ordered. Also blood work recommended by neurology also ordered. Currently remains NPO.  PT OT therapy consulted. Passed swallowing  evaluation. Continue aspirin.  Statin.  Unable to patient will be  Recurrent falls PT OT consult.  Dementia without behavioral disturbance (Janice Castro)- (present on admission) Prior history of dementia. History of schizophrenia. Uses Artane, trazodone for mood disorder. Monitor.  Hypertension- (present on admission) Blood pressure somewhat elevated. On Norvasc 10 mg.  We will continue.  Tobacco abuse- (present on admission) Nicotine patch once mentation improves.  HLD (hyperlipidemia)- (present on admission) Continuing statin for now.  Underweight- (present on admission) Suspect protein calorie malnutrition. Placing the patient at high risk of poor outcome. Body mass index is 17.79 kg/m.    Subjective: No acute complaint no nausea no vomiting.  Somewhat confused.  She thinks that she is in the home.  Physical Exam: Vitals:   01/19/22 0617 01/19/22 1002 01/19/22 1050 01/19/22 1124  BP: (!) 127/55 (!) 144/78 (!) 113/93 (!) 160/83  Pulse: 67 90 93 82  Resp: _0 Temp: 98.7 F (37.1 C) 98.3 F (36.8 C)  98.2 F (36.8 C)  TempSrc: Oral Oral    SpO2: 100% 100% 100% 99%  Weight:      Height:       General: Appear in mild distress; no visible Abnormal Neck Mass Or lumps, Conjunctiva normal Cardiovascular: S1 and S2 Present, no Murmur, Respiratory: good respiratory effort, Bilateral Air entry present and CTA, no Crackles, no wheezes Abdomen: Bowel Sound present Extremities: no Pedal edema Neurology: alert and oriented to self, left-sided contracture with rigidity although limitation can be also be secondary to pain, right upper extremity tone adequate, difficult to elicit reflexes bilaterally. Gait not checked due to patient safety concerns   Data Reviewed:  I have Reviewed nursing notes, Vitals, and Lab results since pt's last encounter. Pertinent lab  results CBC, BMP I have ordered test including TSH, ESR, folic acid, ANA, CBC, BMP, vitamin D, vitamin B6,  thyroglobulin antibody, thyroperoxidase, PTH, MMA, gliadin antibodies, heavy metals, copper, ceruloplasmin, anti- DNA I have ordered imaging studies MRI brain, C-spine with and without contrast, MRA brain and neck. I have reviewed the last note from neurology,  I have discussed pt's care plan and test results with neurology.   Family Communication: Daughter at bedside.  Disposition: Status is: Observation  Author: Berle Mull, MD 01/19/2022 12:41 PM  For on call review [email or web address].

## 2022-01-19 NOTE — ED Notes (Signed)
Three attempts at the MRI studies were tried, all unsuccessful.  Admitting Dr aware and decision will be made this am about continuing with the MRI's

## 2022-01-19 NOTE — Assessment & Plan Note (Signed)
Prior history of dementia. History of schizophrenia. Uses Artane, trazodone for mood disorder. Monitor.

## 2022-01-19 NOTE — Assessment & Plan Note (Signed)
Suspect protein calorie malnutrition. Placing the patient at high risk of poor outcome. Body mass index is 17.79 kg/m.

## 2022-01-19 NOTE — Transfer of Care (Signed)
Immediate Anesthesia Transfer of Care Note  Patient: Janice Castro  Procedure(s) Performed: MRI WITH ANESTHESIA  Patient Location: PACU  Anesthesia Type:General  Level of Consciousness: drowsy  Airway & Oxygen Therapy: Patient Spontanous Breathing and Patient connected to face mask oxygen  Post-op Assessment: Report given to RN and Post -op Vital signs reviewed and stable  Post vital signs: Reviewed and stable  Last Vitals:  Vitals Value Taken Time  BP 147/81 01/19/22 1455  Temp    Pulse 73 01/19/22 1456  Resp 19 01/19/22 1456  SpO2 95 % 01/19/22 1456  Vitals shown include unvalidated device data.  Last Pain:  Vitals:   01/19/22 1050  TempSrc:   PainSc: 0-No pain         Complications: No notable events documented.

## 2022-01-19 NOTE — ED Notes (Signed)
Unsuccessful IV attempt x2. Will call IV team

## 2022-01-19 NOTE — ED Notes (Signed)
Unable to collect labs at this time, IV does not draw blood back, flushes without issue.

## 2022-01-19 NOTE — ED Notes (Signed)
Pt from bathroom via wc, daughter with pt, care link at bedside, report to care link, pt states that she is ready to go to [facility].  Pt from dpt via care link to [facility] for mri.

## 2022-01-20 ENCOUNTER — Encounter (HOSPITAL_COMMUNITY): Payer: Self-pay | Admitting: Radiology

## 2022-01-20 LAB — ANA W/REFLEX IF POSITIVE: Anti Nuclear Antibody (ANA): NEGATIVE

## 2022-01-20 LAB — ANTIEXTRACTABLE NUCLEAR AG
ENA SM Ab Ser-aCnc: <0.2 AI (ref 0.0–0.9)
Ribonucleic Protein: 0.2 AI (ref 0.0–0.9)

## 2022-01-20 LAB — ANTI-DNA ANTIBODY, DOUBLE-STRANDED: ds DNA Ab: <1 IU/mL (ref 0–9)

## 2022-01-20 LAB — PTH, INTACT AND CALCIUM
Calcium, Total (PTH): 9.4 mg/dL (ref 8.7–10.3)
PTH: 25 pg/mL (ref 15–65)

## 2022-01-20 LAB — THYROID PEROXIDASE ANTIBODY: Thyroperoxidase Ab SerPl-aCnc: <9 IU/mL (ref 0–34)

## 2022-01-20 LAB — CERULOPLASMIN: Ceruloplasmin: 24.8 mg/dL (ref 19.0–39.0)

## 2022-01-20 LAB — GLIADIN ANTIBODIES, SERUM
Antigliadin Abs, IgA: 3 units (ref 0–19)
Gliadin IgG: 2 units (ref 0–19)

## 2022-01-20 MED ORDER — LIP MEDEX EX OINT
TOPICAL_OINTMENT | CUTANEOUS | Status: DC | PRN
  Administered 2022-01-20: 75 via TOPICAL
  Filled 2022-01-20: qty 7

## 2022-01-20 MED ORDER — DEXAMETHASONE 2 MG PO TABS
2.0000 mg | ORAL_TABLET | Freq: Four times a day (QID) | ORAL | Status: DC
  Administered 2022-01-20 – 2022-01-21 (×2): 2 mg via ORAL
  Filled 2022-01-20 (×2): qty 1

## 2022-01-20 NOTE — Progress Notes (Signed)
Patient pull 2 IV's out again.  Refusing to replace.  Will wait for daughter to come back to help convince her.  She said she would come back around 6-7.

## 2022-01-20 NOTE — Progress Notes (Signed)
° °Janice Castro  MRN:[id number] DOB: 10/07/1936 DOA: 01/18/2022 °PCP: Garba, Mohammad L, MD   ° °Brief Narrative:  °86yo with a history of schizophrenia, anemia, HTN, tobacco abuse, HLD, and recently diagnosed dementia who presented to the ED after suffering a fall at home.  Of note she has been undergoing a work-up since suddenly being unable to walk Thanksgiving this year: °12/3 seen in ER.  MRI brain did not reveal an acute infarct. °12/9 seen by neurology.  Metabolic work-up, MRA PT?OT recommended. °2/13 admitted to the hospital for recurrent fall.  Concern with right upper extremity contracture on top of left upper extremity contracture. °2/14 transfer to [facility], MRI under anesthesia, neurology consult. ° °Consultants:  °Neurology ° °Code Status: FULL CODE ° °Antimicrobials:  °None ° °DVT prophylaxis: °Lovenox ° °Interim Hx: °Patient is alert and conversant but confused.  She cannot tell me where she is or why she is here.  As I am partly into her exam she tells me to get out of her room.  I met with the patient's daughter at length in the hallway.  We discussed the results of her sedated MRI which revealed significant stenosis in the cervical spine causing cord compression.  I explained the findings to her.  I explained to her that normally surgery will be indicated to relieve the compression to the spine.  Further explained that given the chronicity of her symptoms that I did not feel that surgery would likely result in significant improvement in her left arm contractures.  I did state that there was certainly a likelihood of progressive worsening of the right upper extremity due to the the impingement on the spinal cord.  I have explained that gradual progression of this issue would likely lead to incapacity and death.  We have discussed the possibility of a significant surgical intervention.  Both the patient's daughter and myself do not feel this is consistent with what the patient would wish  given that she has stated multiple times during her hospital stay she just wants to go home and frequently asks staff to leave her alone.  I have discussed her care briefly with the on-call neurosurgeon who felt this approach was reasonable given the overview of the case that I gave him on the telephone.  The goal at this time is to maximize her comfort and quality of life and arrange for as much assistance as home as possible to minimize the risk for future hospitalizations and to keep her at home with her daughter as long as possible. ° °Assessment & Plan: ° °Subacute critical cervical stenosis °Progressively worsening left upper extremity contractures -has been undergoing outpatient work-up -MRI under sedation noted significant cervical stenosis -as per lengthy discussion above surgery is not felt to be an appropriate intervention for this patient -goal is to focus on quality of life and return the patient home as soon as possible ° °Recurrent falls °Due to above -patient likely to progress to full bedridden state therefore significant home health will be required ° °Dementia of unclear etiology with behavioral disturbance °Previous outpatient neurology evaluations opined that this likely represented vascular versus Alzheimer's dementia but the patient previously declined further evaluation ° °Schizophrenia ° °HTN °No indication for strict control at this time -avoid extremes ° °HLD °No indication for ongoing medical therapy given the clinical scenario ° °Underweight - Body mass index is 17.79 kg/m². ° ° °Family Communication: Spoke at length with the patient's daughter at bedside °Disposition: from home w/   w/ daughter -to return home with daughter with maximal home health assistance hopefully to include hospice services  Objective: Blood pressure (!) 152/76, pulse 92, temperature 98.8 F (37.1 C), temperature source Oral, resp. rate 18, height 5' 4" (1.626 m), weight 47 kg, SpO2 99 %.  Intake/Output Summary  (Last 24 hours) at 01/20/2022 1017 Last data filed at 01/20/2022 0847 Gross per 24 hour  Intake 1260 ml  Output --  Net 1260 ml   Filed Weights   01/18/22 1107  Weight: 47 kg    Examination: General: No acute respiratory distress Lungs: Clear to auscultation bilaterally without wheezes or crackles Cardiovascular: Regular rate and rhythm without murmur gallop or rub normal S1 and S2 Abdomen: Nontender, nondistended, soft, bowel sounds positive, no rebound, no ascites, no appreciable mass Extremities: No significant cyanosis, clubbing, or edema bilateral lower extremities  CBC: Recent Labs  Lab 01/18/22 1345 01/18/22 1412 01/19/22 1018  WBC 6.3  --  7.4  NEUTROABS 3.8  --   --   HGB 12.5 12.9 12.1  HCT 37.6 38.0 36.0  MCV 97.4  --  97.0  PLT 317  --  824   Basic Metabolic Panel: Recent Labs  Lab 01/18/22 1345 01/18/22 1412 01/19/22 1018  NA 141 142 141  K 3.9 4.0 4.1  CL 106 105 108  CO2 28  --  25  GLUCOSE 94 90 109*  BUN _0 CREATININE 0.64 0.50 0.74  CALCIUM 9.3  --  9.1   GFR: Estimated Creatinine Clearance: 39.5 mL/min (by C-G formula based on SCr of 0.74 mg/dL).  Liver Function Tests: Recent Labs  Lab 01/18/22 1345  AST 25  ALT 25  ALKPHOS 120  BILITOT 0.4  PROT 7.6  ALBUMIN 4.1    Coagulation Profile: Recent Labs  Lab 01/18/22 1345  INR 1.0    Scheduled Meds:  amLODipine  10 mg Oral Daily   aspirin EC  81 mg Oral Daily   dexamethasone (DECADRON) injection  2 mg Intravenous Q6H   enoxaparin (LOVENOX) injection  30 mg Subcutaneous Q24H   simvastatin  5 mg Oral Daily   sodium chloride flush  3 mL Intravenous Q12H     LOS: 1 day   Cherene Altes, MD [facility] Office  [phone] Pager - Text Page per Shea Evans  If 7PM-7AM, please contact night-coverage per Amion 01/20/2022, 10:17 AM

## 2022-01-20 NOTE — Progress Notes (Signed)
°  Transition of Care [facility]) Screening Note   Patient Details  Name: Janice Castro Date of Birth: [birth year]   Transition of [facility]) CM/SW Contact:    Pollie Friar, RN Phone Number: 01/20/2022, 4:33 PM    Transition of Care Department [facility]) has reviewed patient and no TOC needs have been identified at this time. We will continue to monitor patient advancement through interdisciplinary progression rounds. If new patient transition needs arise, please place a TOC consult.

## 2022-01-20 NOTE — Plan of Care (Signed)
Patient remains very irritable and pulling Ivs out.  Often refuses care from all care team members.

## 2022-01-21 NOTE — Plan of Care (Signed)
Pt is alert oriented x 1 pt has been irritable and refusing care. Pts daughter present. Pt did take PO steroid but refused lovenox injection. Education provided, pt agitation noted to be increased. No distress noted. Denies pain.   Problem: Education: Goal: Knowledge of General Education information will improve Description: Including pain rating scale, medication(s)/side effects and non-pharmacologic comfort measures Outcome: Progressing   Problem: Health Behavior/Discharge Planning: Goal: Ability to manage health-related needs will improve Outcome: Progressing   Problem: Clinical Measurements: Goal: Ability to maintain clinical measurements within normal limits will improve Outcome: Progressing Goal: Will remain free from infection Outcome: Progressing Goal: Diagnostic test results will improve Outcome: Progressing Goal: Respiratory complications will improve Outcome: Progressing Goal: Cardiovascular complication will be avoided Outcome: Progressing   Problem: Activity: Goal: Risk for activity intolerance will decrease Outcome: Progressing   Problem: Nutrition: Goal: Adequate nutrition will be maintained Outcome: Progressing   Problem: Coping: Goal: Level of anxiety will decrease Outcome: Progressing   Problem: Elimination: Goal: Will not experience complications related to bowel motility Outcome: Progressing Goal: Will not experience complications related to urinary retention Outcome: Progressing   Problem: Pain Managment: Goal: General experience of comfort will improve Outcome: Progressing   Problem: Safety: Goal: Ability to remain free from injury will improve Outcome: Progressing   Problem: Skin Integrity: Goal: Risk for impaired skin integrity will decrease Outcome: Progressing

## 2022-01-21 NOTE — Progress Notes (Signed)
Pending extensive home health DME needs / home hospice

## 2022-01-21 NOTE — Progress Notes (Signed)
Janice Castro  KJZ:[id number] DOB: [birth year] DOA: 01/18/2022 PCP: Rometta Emery, MD    Brief Narrative:  86yo with a history of schizophrenia, anemia, HTN, tobacco abuse, HLD, and recently diagnosed dementia who presented to the ED after suffering a fall at home.  Of note she has been undergoing a work-up since suddenly being unable to walk Thanksgiving this year: 12/3 seen in ER.  MRI brain did not reveal an acute infarct. 12/9 seen by neurology.  Metabolic work-up, MRA PT?OT recommended. 2/13 admitted to the hospital for recurrent fall.  Concern with right upper extremity contracture on top of left upper extremity contracture. 2/14 transfer to [facility], MRI under anesthesia, neurology consult.  Consultants:  Neurology  Code Status: FULL CODE  Antimicrobials:  None  DVT prophylaxis: Lovenox  Interim Hx: No acute events since yesterday.  Patient has remained irritable and is frequently refusing care.  TOC is actively working to arrange necessary equipment and to refer patient for home hospice services.  She is more responsive to me today, and smiles prominently when I ask her if she wants to go home tomorrow.  Assessment & Plan:  Subacute critical cervical stenosis Progressively worsening left upper extremity contractures -has been undergoing outpatient work-up -MRI under sedation noted significant cervical stenosis - surgery is not felt to be an appropriate intervention for this patient -goal is to focus on quality of life and return the patient home as soon as possible with hospice services and maximum DME  Recurrent falls Due to above -patient likely to progress to full bedridden state therefore significant home health will be required  Dementia of unclear etiology with behavioral disturbance Previous outpatient neurology evaluations opined that this likely represented vascular versus Alzheimer's dementia but the patient previously declined further  evaluation  Schizophrenia Does not appear to be on active treatment at this time per review of home medication list  HTN No indication for strict control at this time -avoid extremes  HLD No indication for ongoing medical therapy given the clinical scenario  Underweight - Body mass index is 17.79 kg/m.   Family Communication: No family present at time of exam today Disposition: from home w/ daughter -to return home with daughter with maximal home health assistance hopefully to include hospice services -goal is discharge home 2/17  Objective: Blood pressure (!) 143/69, pulse 79, temperature 98.4 F (36.9 C), temperature source Oral, resp. rate 16, height 5\' 4"  (1.626 m), weight 47 kg, SpO2 98 %.  Intake/Output Summary (Last 24 hours) at 01/21/2022 1057 Last data filed at 01/21/2022 0719 Gross per 24 hour  Intake 180 ml  Output --  Net 180 ml    Filed Weights   01/18/22 1107  Weight: 47 kg    Examination: General: No acute respiratory distress Lungs: Clear to auscultation bilaterally  Cardiovascular: RRR without murmur Abdomen: NT/ND, soft Extremities: No significant edema bilateral lower extremities  CBC: Recent Labs  Lab 01/18/22 1345 01/18/22 1412 01/19/22 1018  WBC 6.3  --  7.4  NEUTROABS 3.8  --   --   HGB 12.5 12.9 12.1  HCT 37.6 38.0 36.0  MCV 97.4  --  97.0  PLT 317  --  309    Basic Metabolic Panel: Recent Labs  Lab 01/18/22 1345 01/18/22 1412 01/19/22 1018  NA 141 142 141  K 3.9 4.0 4.1  CL 106 105 108  CO2 28  --  25  GLUCOSE 94 90 109*  BUN 18 16 16  CREATININE 0.64 0.50 0.74  CALCIUM 9.3  --  9.1   9.4    GFR: Estimated Creatinine Clearance: 39.5 mL/min (by C-G formula based on SCr of 0.74 mg/dL).  Liver Function Tests: Recent Labs  Lab 01/18/22 1345  AST 25  ALT 25  ALKPHOS 120  BILITOT 0.4  PROT 7.6  ALBUMIN 4.1    Coagulation Profile: Recent Labs  Lab 01/18/22 1345  INR 1.0     Scheduled Meds:  amLODipine  10  mg Oral Daily   aspirin EC  81 mg Oral Daily   dexamethasone  2 mg Oral Q6H   enoxaparin (LOVENOX) injection  30 mg Subcutaneous Q24H   sodium chloride flush  3 mL Intravenous Q12H     LOS: 2 days   Lonia Blood, MD [facility] Office  [phone] Pager - Text Page per Loretha Stapler  If 7PM-7AM, please contact night-coverage per Amion 01/21/2022, 10:57 AM

## 2022-01-21 NOTE — Progress Notes (Addendum)
[employer] [facility]) Hospital Liaison Note  Referral received from [facility] for patient/family interest in home with hospice. [facility] confirmed interest with patient's daughter Arnita.   Hospice eligibility confirmed.   Plan is to discharge home tomorrow via private vehicle.   DME in the home: transport wheelchair and shower bench.   DME ordered: hospital bed, hoyer lift, over the bed table, and portable ramp.   DME has been ordered with plans for delivery tomorrow.   Please send comfort scripts/medications home with patient.   Please call with any questions or concerns. Thank you.   Buck Mam [facility] Liaison [phone]

## 2022-01-21 NOTE — TOC Initial Note (Signed)
Transition of Care [facility]) - Initial/Assessment Note    Patient Details  Name: Janice Castro MRN: [id number] Date of Birth: [birth year]  Transition of Care [facility]) CM/SW Contact:    Pollie Friar, RN Phone Number: 01/21/2022, 10:52 AM  Clinical Narrative:                 Patient is from home with her daughter. The plan is for home with hospice services. CM met with the patient and her daughter. Daughter provided choice and [facility] decided on. CM has sent the referral to [place].  Daughter requesting hospital bed/ lyft/ table and ramp for home. CM has updated Shanita with [facility]. Daughter needs until tomorrow to get the furniture at home moved to get the bed into the patients bedroom.  CM has verified the home address.  TOC following.  Expected Discharge Plan: Home w Hospice Care Barriers to Discharge: Continued Medical Work up   Patient Goals and CMS Choice   CMS Medicare.gov Compare Post Acute Care list provided to:: Patient Represenative (must comment) Choice offered to / list presented to : Adult Children  Expected Discharge Plan and Services Expected Discharge Plan: [place]   Discharge Planning Services: CM Consult Post Acute Care Choice: Hospice Living arrangements for the past 2 months: [place]                 DME Arranged: Hospital bed, Overbed table           [facility] Agency: Hospice and [place] Date [place]: 01/21/22   Representative spoke with at [place]: Fabio Pierce  Prior Living Arrangements/Services Living arrangements for the past 2 months: [place] Lives with:: Adult Children Patient language and need for interpreter reviewed:: Yes        Need for Family Participation in Patient Care: Yes (Comment) Care giver support system in place?: Yes (comment)   Criminal Activity/Legal Involvement Pertinent to Current Situation/Hospitalization: No - Comment as needed  Activities of Daily  Living      Permission Sought/Granted                  Emotional Assessment Appearance:: Appears stated age     Orientation: : Oriented to Self   Psych Involvement: No (comment)  Admission diagnosis:  Ataxia [R27.0] Weakness [R53.1] Patient Active Problem List   Diagnosis Date Noted   Dementia without behavioral disturbance ([place]) 01/19/2022   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

## 2022-01-22 LAB — COPPER, SERUM: Copper: 104 ug/dL (ref 80–158)

## 2022-01-22 LAB — VITAMIN B6: Vitamin B6: 4.3 ug/L (ref 3.4–65.2)

## 2022-01-22 LAB — HEAVY METALS, BLOOD
Arsenic: 2 ug/L (ref 0–9)
Lead: 1.1 ug/dL (ref 0.0–3.4)
Mercury: <1 ug/L (ref 0.0–14.9)

## 2022-01-22 MED ORDER — ACETAMINOPHEN 325 MG PO TABS: 650.0000 mg | ORAL_TABLET | Freq: Four times a day (QID) | ORAL | Status: AC | PRN

## 2022-01-22 NOTE — Progress Notes (Signed)
Discharge instruction given. Patient's daughter verbalized understanding and all questions were answered.

## 2022-01-22 NOTE — Plan of Care (Signed)
Pt is alert oriented x 2. Pts daughter is present in the room. Pts daughter assists with ADLs. Pt scheduled for discharge 2/17. Daughter will ensure Bed and other necessities are properly delivered and installed before taking pt home. Pt c/o left arm pain prn tylenol and heating pad applied for pain. Pt noted resting with eyes closed. When tech entered room and obtained VS pt asked about pain. She stated she was not hurting.    Problem: Education: Goal: Knowledge of General Education information will improve Description: Including pain rating scale, medication(s)/side effects and non-pharmacologic comfort measures Outcome: Progressing   Problem: Health Behavior/Discharge Planning: Goal: Ability to manage health-related needs will improve Outcome: Progressing   Problem: Clinical Measurements: Goal: Ability to maintain clinical measurements within normal limits will improve Outcome: Progressing Goal: Will remain free from infection Outcome: Progressing Goal: Diagnostic test results will improve Outcome: Progressing Goal: Respiratory complications will improve Outcome: Progressing Goal: Cardiovascular complication will be avoided Outcome: Progressing   Problem: Activity: Goal: Risk for activity intolerance will decrease Outcome: Progressing   Problem: Nutrition: Goal: Adequate nutrition will be maintained Outcome: Progressing   Problem: Coping: Goal: Level of anxiety will decrease Outcome: Progressing   Problem: Elimination: Goal: Will not experience complications related to bowel motility Outcome: Progressing Goal: Will not experience complications related to urinary retention Outcome: Progressing   Problem: Pain Managment: Goal: General experience of comfort will improve Outcome: Progressing   Problem: Safety: Goal: Ability to remain free from injury will improve Outcome: Progressing   Problem: Skin Integrity: Goal: Risk for impaired skin integrity will  decrease Outcome: Progressing

## 2022-01-22 NOTE — TOC Transition Note (Signed)
Transition of Care [facility]) - CM/SW Discharge Note   Patient Details  Name: HONORE WIPPERFURTH MRN: [id number] Date of Birth: [birth year]  Transition of Care [facility]) CM/SW Contact:  Kermit Balo, RN Phone Number: 01/22/2022, 3:39 PM   Clinical Narrative:    Patient is discharging home with hospice services through [facility]. Shanita with [facility] is aware of discharge. Per daughter the DME has been delivered to the home. Daughter providing transport home.    Final next level of care: Hospice Medical Facility Barriers to Discharge: No Barriers Identified   Patient Goals and CMS Choice   CMS Medicare.gov Compare Post Acute Care list provided to:: Patient Represenative (must comment) Choice offered to / list presented to : Adult Children  Discharge Placement                       Discharge Plan and Services   Discharge Planning Services: CM Consult Post Acute Care Choice: Hospice          DME Arranged: Hospital bed, Overbed table           [facility] Agency: [facility] Date [facility] Agency Contacted: 01/21/22   Representative spoke with at [place] Agency: Danford Bad  Social Determinants of Health (SDOH) Interventions     Readmission Risk Interventions No flowsheet data found.

## 2022-01-22 NOTE — Care Management Important Message (Signed)
Important Message  Patient Details  Name: Janice Castro MRN: [id number] Date of Birth: [birth year]   Medicare Important Message Given:  Yes     Orbie Pyo 01/22/2022, 2:20 PM

## 2022-01-22 NOTE — Discharge Summary (Signed)
DISCHARGE SUMMARY  Janice Castro  MR#: [id number]  DOB:04/03/1936  Date of Admission: 01/18/2022 Date of Discharge: 01/22/2022  Attending Physician:Kainon Varady Hennie Duos, MD  Patient's [id number], Janice Newcomer, MD  Consults: Neurology   Disposition: D/C home for comfort focused care - focus on quality of life    Follow-up Appts:  Follow-up Information     Elwyn Reach, MD Follow up in 1 week(s).   Specialty: Internal Medicine Contact information: [place] [place] [address] [phone]                 Discharge Diagnoses: Subacute critical cervical stenosis Recurrent falls Dementia of unclear etiology with behavioral disturbance Schizophrenia HTN HLD Underweight - Body mass index is 17.79 kg/m.   Initial presentation: 86yo with a history of schizophrenia, anemia, HTN, tobacco abuse, HLD, and recently diagnosed dementia who presented to the ED after suffering a fall at home.  Of note she has been undergoing a work-up since suddenly being unable to walk Thanksgiving this year: 12/3 seen in ER.  MRI brain did not reveal an acute infarct. 12/9 seen by neurology.  Metabolic work-up, MRA PT?OT recommended. 2/13 admitted to the hospital for recurrent fall.  Concern with right upper extremity contracture on top of left upper extremity contracture. 2/14 transfer to [facility], MRI under anesthesia, neurology consult.  Hospital Course:  Subacute critical cervical stenosis Progressively worsening left upper extremity contractures -has been undergoing outpatient work-up -MRI under sedation noted significant cervical stenosis - surgery is not felt to be an appropriate intervention for this patient -goal is to focus on quality of life and return the patient home as soon as possible with hospice services and maximum DME   Recurrent falls Due to above -patient likely to progress to full bedridden state therefore significant home health will be  required   Dementia of unclear etiology with behavioral disturbance Previous outpatient neurology evaluations opined that this likely represented vascular versus Alzheimer's dementia but the patient previously declined further evaluation   Schizophrenia Does not appear to be on active treatment at this time per review of home medication list   HTN No indication for strict control at this time -avoid extremes   HLD No indication for ongoing medical therapy given the clinical scenario   Underweight - Body mass index is 17.79 kg/m.  Goals of Care Patient's daughter understands that spine surgery is not a valid option, and that this condition will likely progress and ultimately lead to full debilitation and death - daughter appropriately wishes to focus on quality of life, and getting the pt back home where she is happy - she is not yet, however, comfortable committing to NCB/DNR status - this conversation can be continued in the outpt setting as her condition evolves     Allergies as of 01/22/2022   No Known Allergies      Medication List     STOP taking these medications    amLODipine 10 MG tablet Commonly known as: NORVASC   aspirin EC 81 MG tablet   haloperidol decanoate 100 MG/ML injection Commonly known as: HALDOL DECANOATE   nicotine 21 mg/24hr patch Commonly known as: NICODERM CQ - dosed in mg/24 hours   simvastatin 5 MG tablet Commonly known as: ZOCOR   trihexyphenidyl 2 MG tablet Commonly known as: ARTANE   Vitamin C Immune Health 500 MG Chew Generic drug: Ascorbic Acid   Vitamin D3 25 MCG (1000 UT) Chew  TAKE these medications    acetaminophen 325 MG tablet Commonly known as: TYLENOL Take 2 tablets (650 mg total) by mouth every 6 (six) hours as needed for mild pain (or Fever >/= 101).   docusate sodium 50 MG capsule Commonly known as: COLACE Take 50 mg by mouth every other day.   traZODone 50 MG tablet Commonly known as: DESYREL Take  25-50 mg by mouth at bedtime as needed for sleep.        Day of Discharge BP (!) 163/87 (BP Location: Left Arm)    Pulse 80    Temp 97.6 F (36.4 C) (Oral)    Resp 18    Ht 5\' 4"  (1.626 m)    Wt 47 kg    SpO2 99%    BMI 17.79 kg/m   Physical Exam: General: No acute respiratory distress Lungs: Clear to auscultation bilaterally without wheezes or crackles Cardiovascular: Regular rate and rhythm without murmur gallop or rub normal S1 and S2 Abdomen: Nontender, nondistended, soft, bowel sounds positive, no rebound, no ascites, no appreciable mass Extremities: No significant cyanosis, clubbing, or edema bilateral lower extremities  Basic Metabolic Panel: Recent Labs  Lab 01/18/22 1345 01/18/22 1412 01/19/22 1018  NA 141 142 141  K 3.9 4.0 4.1  CL 106 105 108  CO2 28  --  25  GLUCOSE 94 90 109*  BUN 18 16 16   CREATININE 0.64 0.50 0.74  CALCIUM 9.3  --  9.1   9.4    Liver Function Tests: Recent Labs  Lab 01/18/22 1345  AST 25  ALT 25  ALKPHOS 120  BILITOT 0.4  PROT 7.6  ALBUMIN 4.1    Coags: Recent Labs  Lab 01/18/22 1345  INR 1.0    CBC: Recent Labs  Lab 01/18/22 1345 01/18/22 1412 01/19/22 1018  WBC 6.3  --  7.4  NEUTROABS 3.8  --   --   HGB 12.5 12.9 12.1  HCT 37.6 38.0 36.0  MCV 97.4  --  97.0  PLT 317  --  309    Time spent in discharge (includes decision making & examination of pt): 35 minutes  01/22/2022, 3:04 PM   Cherene Altes, MD [facility] Office  [phone]

## 2022-01-23 LAB — THYROGLOBULIN ANTIBODY: Thyroglobulin Antibody: <1 IU/mL (ref 0.0–0.9)

## 2022-01-24 LAB — VITAMIN E
Vitamin E (Alpha Tocopherol): 11 mg/L (ref 9.0–29.0)
Vitamin E(Gamma Tocopherol): 1.3 mg/L (ref 0.5–4.9)

## 2022-01-26 LAB — METHYLMALONIC ACID, SERUM: Methylmalonic Acid, Quantitative: 230 nmol/L (ref 0–378)

## 2022-04-09 ENCOUNTER — Ambulatory Visit: Payer: Medicare Other | Admitting: Cardiology

## 2023-04-06 DEATH — deceased
